# Patient Record
Sex: Male | Born: 1955 | Race: White | Hispanic: No | Marital: Single | Smoking: Current every day smoker
Health system: Southern US, Community
[De-identification: ages and names within clinical notes are randomized; demographics above are authoritative.]

## PROBLEM LIST (undated history)

## (undated) DIAGNOSIS — M1990 Unspecified osteoarthritis, unspecified site: Secondary | ICD-10-CM

## (undated) DIAGNOSIS — K219 Gastro-esophageal reflux disease without esophagitis: Secondary | ICD-10-CM

## (undated) DIAGNOSIS — Z973 Presence of spectacles and contact lenses: Secondary | ICD-10-CM

## (undated) DIAGNOSIS — I011 Acute rheumatic endocarditis: Secondary | ICD-10-CM

## (undated) DIAGNOSIS — R682 Dry mouth, unspecified: Secondary | ICD-10-CM

## (undated) DIAGNOSIS — Z972 Presence of dental prosthetic device (complete) (partial): Secondary | ICD-10-CM

## (undated) DIAGNOSIS — F17200 Nicotine dependence, unspecified, uncomplicated: Secondary | ICD-10-CM

## (undated) DIAGNOSIS — G4730 Sleep apnea, unspecified: Secondary | ICD-10-CM

## (undated) DIAGNOSIS — J45909 Unspecified asthma, uncomplicated: Secondary | ICD-10-CM

## (undated) DIAGNOSIS — E039 Hypothyroidism, unspecified: Secondary | ICD-10-CM

## (undated) DIAGNOSIS — I10 Essential (primary) hypertension: Secondary | ICD-10-CM

## (undated) HISTORY — PX: COLONOSCOPY: SHX174

## (undated) HISTORY — PX: WISDOM TOOTH EXTRACTION: SHX21

## (undated) HISTORY — PX: MULTIPLE TOOTH EXTRACTIONS: SHX2053

## (undated) HISTORY — PX: ROTATOR CUFF REPAIR: SHX139

## (undated) HISTORY — PX: HERNIA REPAIR: SHX51

---

## 2019-07-29 ENCOUNTER — Other Ambulatory Visit: Payer: Self-pay | Admitting: Otolaryngology

## 2019-08-03 ENCOUNTER — Ambulatory Visit (HOSPITAL_BASED_OUTPATIENT_CLINIC_OR_DEPARTMENT_OTHER): Admission: RE | Admit: 2019-08-03 | Payer: Medicare HMO | Source: Home / Self Care | Admitting: Otolaryngology

## 2019-08-03 ENCOUNTER — Encounter (HOSPITAL_BASED_OUTPATIENT_CLINIC_OR_DEPARTMENT_OTHER): Admission: RE | Payer: Self-pay | Source: Home / Self Care

## 2019-08-03 SURGERY — DRUG INDUCED SLEEP ENDOSCOPY
Anesthesia: General

## 2019-08-05 ENCOUNTER — Other Ambulatory Visit: Payer: Self-pay

## 2019-08-05 ENCOUNTER — Encounter (HOSPITAL_BASED_OUTPATIENT_CLINIC_OR_DEPARTMENT_OTHER): Payer: Self-pay | Admitting: Otolaryngology

## 2019-08-11 ENCOUNTER — Other Ambulatory Visit (HOSPITAL_COMMUNITY): Payer: Medicare HMO

## 2019-08-12 NOTE — Progress Notes (Signed)
Reminded pt to go for covid test, pt states will go tomorrow, appt scheduled.

## 2019-08-13 ENCOUNTER — Other Ambulatory Visit (HOSPITAL_COMMUNITY)
Admission: RE | Admit: 2019-08-13 | Discharge: 2019-08-13 | Disposition: A | Discharge status: Home / Self Care | Payer: Medicare HMO | Source: Ambulatory Visit | Attending: Otolaryngology | Admitting: Otolaryngology

## 2019-08-13 DIAGNOSIS — Z01812 Encounter for preprocedural laboratory examination: Secondary | ICD-10-CM | POA: Insufficient documentation

## 2019-08-13 DIAGNOSIS — Z20822 Contact with and (suspected) exposure to covid-19: Secondary | ICD-10-CM | POA: Diagnosis not present

## 2019-08-13 LAB — SARS CORONAVIRUS 2 (TAT 6-24 HRS): SARS Coronavirus 2: NEGATIVE

## 2019-08-15 ENCOUNTER — Ambulatory Visit (HOSPITAL_BASED_OUTPATIENT_CLINIC_OR_DEPARTMENT_OTHER): Payer: Medicare HMO | Admitting: Certified Registered"

## 2019-08-15 ENCOUNTER — Other Ambulatory Visit: Payer: Self-pay

## 2019-08-15 ENCOUNTER — Encounter (HOSPITAL_BASED_OUTPATIENT_CLINIC_OR_DEPARTMENT_OTHER): Payer: Self-pay | Admitting: Otolaryngology

## 2019-08-15 ENCOUNTER — Ambulatory Visit (HOSPITAL_BASED_OUTPATIENT_CLINIC_OR_DEPARTMENT_OTHER)
Admission: RE | Admit: 2019-08-15 | Discharge: 2019-08-15 | Disposition: A | Discharge status: Home / Self Care | Payer: Medicare HMO | Attending: Otolaryngology | Admitting: Otolaryngology

## 2019-08-15 ENCOUNTER — Encounter (HOSPITAL_BASED_OUTPATIENT_CLINIC_OR_DEPARTMENT_OTHER)
Admission: RE | Disposition: A | Discharge status: Home / Self Care | Payer: Self-pay | Source: Home / Self Care | Attending: Otolaryngology

## 2019-08-15 DIAGNOSIS — M069 Rheumatoid arthritis, unspecified: Secondary | ICD-10-CM | POA: Diagnosis not present

## 2019-08-15 DIAGNOSIS — Z7951 Long term (current) use of inhaled steroids: Secondary | ICD-10-CM | POA: Insufficient documentation

## 2019-08-15 DIAGNOSIS — I10 Essential (primary) hypertension: Secondary | ICD-10-CM | POA: Diagnosis not present

## 2019-08-15 DIAGNOSIS — E039 Hypothyroidism, unspecified: Secondary | ICD-10-CM | POA: Diagnosis not present

## 2019-08-15 DIAGNOSIS — Z79899 Other long term (current) drug therapy: Secondary | ICD-10-CM | POA: Diagnosis not present

## 2019-08-15 DIAGNOSIS — Z7952 Long term (current) use of systemic steroids: Secondary | ICD-10-CM | POA: Diagnosis not present

## 2019-08-15 DIAGNOSIS — Z79890 Hormone replacement therapy (postmenopausal): Secondary | ICD-10-CM | POA: Insufficient documentation

## 2019-08-15 DIAGNOSIS — J45909 Unspecified asthma, uncomplicated: Secondary | ICD-10-CM | POA: Insufficient documentation

## 2019-08-15 DIAGNOSIS — Z791 Long term (current) use of non-steroidal anti-inflammatories (NSAID): Secondary | ICD-10-CM | POA: Insufficient documentation

## 2019-08-15 DIAGNOSIS — G4733 Obstructive sleep apnea (adult) (pediatric): Secondary | ICD-10-CM | POA: Insufficient documentation

## 2019-08-15 DIAGNOSIS — F17210 Nicotine dependence, cigarettes, uncomplicated: Secondary | ICD-10-CM | POA: Diagnosis not present

## 2019-08-15 DIAGNOSIS — K219 Gastro-esophageal reflux disease without esophagitis: Secondary | ICD-10-CM | POA: Insufficient documentation

## 2019-08-15 HISTORY — PX: DRUG INDUCED ENDOSCOPY: SHX6808

## 2019-08-15 HISTORY — DX: Sleep apnea, unspecified: G47.30

## 2019-08-15 HISTORY — DX: Essential (primary) hypertension: I10

## 2019-08-15 HISTORY — DX: Unspecified asthma, uncomplicated: J45.909

## 2019-08-15 HISTORY — DX: Hypothyroidism, unspecified: E03.9

## 2019-08-15 HISTORY — DX: Gastro-esophageal reflux disease without esophagitis: K21.9

## 2019-08-15 HISTORY — DX: Acute rheumatic endocarditis: I01.1

## 2019-08-15 SURGERY — DRUG INDUCED SLEEP ENDOSCOPY
Anesthesia: General | Site: Mouth

## 2019-08-15 MED ORDER — OXYCODONE HCL 5 MG/5ML PO SOLN: 5.0000 mg | Freq: Once | ORAL | Status: DC | PRN

## 2019-08-15 MED ORDER — MIDAZOLAM HCL 2 MG/2ML IJ SOLN: 1.0000 mg | INTRAMUSCULAR | Status: DC | PRN

## 2019-08-15 MED ORDER — FENTANYL CITRATE (PF) 100 MCG/2ML IJ SOLN: 50.0000 ug | INTRAMUSCULAR | Status: DC | PRN

## 2019-08-15 MED ORDER — PROPOFOL 500 MG/50ML IV EMUL
INTRAVENOUS | Status: DC | PRN
  Administered 2019-08-15: 35 ug/kg/min via INTRAVENOUS

## 2019-08-15 MED ORDER — MEPERIDINE HCL 25 MG/ML IJ SOLN: 6.2500 mg | INTRAMUSCULAR | Status: DC | PRN

## 2019-08-15 MED ORDER — FENTANYL CITRATE (PF) 100 MCG/2ML IJ SOLN: 25.0000 ug | INTRAMUSCULAR | Status: DC | PRN

## 2019-08-15 MED ORDER — ACETAMINOPHEN 160 MG/5ML PO SOLN: 325.0000 mg | ORAL | Status: DC | PRN

## 2019-08-15 MED ORDER — ONDANSETRON HCL 4 MG/2ML IJ SOLN: 4.0000 mg | Freq: Once | INTRAMUSCULAR | Status: DC | PRN

## 2019-08-15 MED ORDER — ONDANSETRON HCL 4 MG/2ML IJ SOLN
INTRAMUSCULAR | Status: DC | PRN
  Administered 2019-08-15: 4 mg via INTRAVENOUS

## 2019-08-15 MED ORDER — LACTATED RINGERS IV SOLN: INTRAVENOUS | Status: DC

## 2019-08-15 MED ORDER — ACETAMINOPHEN 325 MG PO TABS: 325.0000 mg | ORAL_TABLET | ORAL | Status: DC | PRN

## 2019-08-15 MED ORDER — OXYCODONE HCL 5 MG PO TABS: 5.0000 mg | ORAL_TABLET | Freq: Once | ORAL | Status: DC | PRN

## 2019-08-15 MED ORDER — OXYMETAZOLINE HCL 0.05 % NA SOLN
NASAL | Status: DC | PRN
  Administered 2019-08-15: 1 via TOPICAL

## 2019-08-15 SURGICAL SUPPLY — 15 items
CANISTER SUCT 1200ML W/VALVE (MISCELLANEOUS) ×3 IMPLANT
COVER WAND RF STERILE (DRAPES) IMPLANT
GLOVE BIO SURGEON STRL SZ7.5 (GLOVE) ×3 IMPLANT
KIT CLEAN ENDO (MISCELLANEOUS) ×3 IMPLANT
NDL PRECISIONGLIDE 27X1.5 (NEEDLE) IMPLANT
NEEDLE PRECISIONGLIDE 27X1.5 (NEEDLE) IMPLANT
PATTIES SURGICAL .5 X3 (DISPOSABLE) ×3 IMPLANT
SET BASIN DAY SURGERY F.S. (CUSTOM PROCEDURE TRAY) ×3 IMPLANT
SHEET MEDIUM DRAPE 40X70 STRL (DRAPES) IMPLANT
SOL ANTI FOG 6CC (MISCELLANEOUS) ×1 IMPLANT
SOLUTION ANTI FOG 6CC (MISCELLANEOUS) ×2
SYR CONTROL 10ML LL (SYRINGE) IMPLANT
TOWEL GREEN STERILE FF (TOWEL DISPOSABLE) ×3 IMPLANT
TUBE CONNECTING 20'X1/4 (TUBING) ×1
TUBE CONNECTING 20X1/4 (TUBING) ×2 IMPLANT

## 2019-08-15 NOTE — Anesthesia Preprocedure Evaluation (Addendum)
Anesthesia Evaluation  Patient identified by MRN, date of birth, ID band Patient awake    Reviewed: Allergy & Precautions, NPO status , Patient's Chart, lab work & pertinent test results  Airway Mallampati: I       Dental no notable dental hx. (+) Teeth Intact   Pulmonary asthma , sleep apnea , Current Smoker and Patient abstained from smoking.,    Pulmonary exam normal breath sounds clear to auscultation       Cardiovascular hypertension, Pt. on medications Normal cardiovascular exam Rhythm:Regular Rate:Normal     Neuro/Psych negative neurological ROS  negative psych ROS   GI/Hepatic Neg liver ROS, GERD  Medicated and Controlled,  Endo/Other  Hypothyroidism   Renal/GU negative Renal ROS  negative genitourinary   Musculoskeletal negative musculoskeletal ROS (+)   Abdominal Normal abdominal exam  (+)   Peds  Hematology negative hematology ROS (+)   Anesthesia Other Findings   Reproductive/Obstetrics                             Anesthesia Physical Anesthesia Plan  ASA: II  Anesthesia Plan: General   Post-op Pain Management:    Induction: Intravenous  PONV Risk Score and Plan: Ondansetron, Dexamethasone and Propofol infusion  Airway Management Planned: Natural Airway and Nasal Cannula  Additional Equipment: None  Intra-op Plan:   Post-operative Plan:   Informed Consent: I have reviewed the patients History and Physical, chart, labs and discussed the procedure including the risks, benefits and alternatives for the proposed anesthesia with the patient or authorized representative who has indicated his/her understanding and acceptance.     Dental advisory given  Plan Discussed with: CRNA  Anesthesia Plan Comments:        Anesthesia Quick Evaluation

## 2019-08-15 NOTE — H&P (Signed)
Zachary Henderson is an 64 y.o. male.   Chief Complaint: Sleep apnea HPI: 64 year old male with obstructive sleep apnea who has had difficulty tolerating CPAP.  He presents of sleep endoscopy.  Past Medical History:  Diagnosis Date  . Asthma   . GERD (gastroesophageal reflux disease)   . Hypertension   . Hypothyroidism   . Rheumatoid aortitis   . Sleep apnea     History reviewed. No pertinent surgical history.  History reviewed. No pertinent family history. Social History:  reports that he has been smoking cigarettes. He has been smoking about 1.00 pack per day. He has never used smokeless tobacco. He reports previous drug use. He reports that he does not drink alcohol.  Allergies:  Allergies  Allergen Reactions  . Other     Peppers, onions and cats      Medications Prior to Admission  Medication Sig Dispense Refill  . acetaminophen (TYLENOL) 650 MG CR tablet Take 650 mg by mouth every 8 (eight) hours as needed for pain.    Marland Kitchen albuterol (VENTOLIN HFA) 108 (90 Base) MCG/ACT inhaler Inhale into the lungs every 6 (six) hours as needed for wheezing or shortness of breath.    Marland Kitchen amitriptyline (ELAVIL) 75 MG tablet Take 75 mg by mouth at bedtime.    Marland Kitchen atorvastatin (LIPITOR) 20 MG tablet Take 20 mg by mouth daily.    . baclofen (LIORESAL) 10 MG tablet Take 10 mg by mouth 3 (three) times daily.    . celecoxib (CELEBREX) 200 MG capsule Take 200 mg by mouth 2 (two) times daily.    . fexofenadine (ALLEGRA) 180 MG tablet Take 180 mg by mouth daily.    . fluticasone (FLONASE) 50 MCG/ACT nasal spray Place into both nostrils daily.    . Fluticasone-Salmeterol (ADVAIR) 250-50 MCG/DOSE AEPB Inhale 1 puff into the lungs 2 (two) times daily.    Marland Kitchen gabapentin (NEURONTIN) 300 MG capsule Take 300 mg by mouth 3 (three) times daily.    . hydroxychloroquine (PLAQUENIL) 200 MG tablet Take by mouth daily.    Marland Kitchen levothyroxine (SYNTHROID) 88 MCG tablet Take 88 mcg by mouth daily before breakfast.    .  lisinopril (ZESTRIL) 10 MG tablet Take 10 mg by mouth daily.    . montelukast (SINGULAIR) 10 MG tablet Take 10 mg by mouth at bedtime.    Marland Kitchen omeprazole (PRILOSEC) 40 MG capsule Take 40 mg by mouth daily.    . predniSONE (DELTASONE) 2.5 MG tablet Take 2.5 mg by mouth daily with breakfast.    . rOPINIRole (REQUIP) 1 MG tablet Take 1 mg by mouth at bedtime.      Results for orders placed or performed during the hospital encounter of 08/13/19 (from the past 48 hour(s))  SARS CORONAVIRUS 2 (TAT 6-24 HRS) Nasopharyngeal Nasopharyngeal Swab     Status: None   Collection Time: 08/13/19 12:15 PM   Specimen: Nasopharyngeal Swab  Result Value Ref Range   SARS Coronavirus 2 NEGATIVE NEGATIVE    Comment: (NOTE) SARS-CoV-2 target nucleic acids are NOT DETECTED. The SARS-CoV-2 RNA is generally detectable in upper and lower respiratory specimens during the acute phase of infection. Negative results do not preclude SARS-CoV-2 infection, do not rule out co-infections with other pathogens, and should not be used as the sole basis for treatment or other patient management decisions. Negative results must be combined with clinical observations, patient history, and epidemiological information. The expected result is Negative. Fact Sheet for Patients: [employer]Slick[employer]no Fact Sheet for Healthcare  Providers: https://www.woods-mathews.com/ This test is not yet approved or cleared by the [place] and  has been authorized for detection and/or diagnosis of SARS-CoV-2 by FDA under an Emergency Use Authorization (EUA). This EUA will remain  in effect (meaning this test can be used) for the duration of the COVID-19 declaration under Section 56 4(b)(1) of the Act, 21 U.S.C. section 360bbb-3(b)(1), unless the authorization is terminated or revoked sooner. Performed at [facility], [place] [address], [place], [place] [address]    No results found.  Review of  Systems  All other systems reviewed and are negative.   Blood pressure 135/86, pulse 79, temperature (!) 97.5 F (36.4 C), temperature source Oral, resp. rate 20, height 5\' 5"  (1.651 m), weight 75.4 kg, SpO2 100 %. Physical Exam  Constitutional: He is oriented to person, place, and time. He appears well-developed and well-nourished. No distress.  HENT:  Head: Normocephalic and atraumatic.  Right Ear: External ear normal.  Left Ear: External ear normal.  Nose: Nose normal.  Mouth/Throat: Oropharynx is clear and moist.  Eyes: Pupils are equal, round, and reactive to light. Conjunctivae and EOM are normal.  Cardiovascular: Normal rate.  Respiratory: Effort normal.  Musculoskeletal:     Cervical back: Normal range of motion and neck supple.  Neurological: He is alert and oriented to person, place, and time. No cranial nerve deficit.  Skin: Skin is warm and dry.  Psychiatric: He has a normal mood and affect. His behavior is normal. Judgment and thought content normal.     Assessment/Plan Sleep apnea  To OR for drug-induced sleep endoscopy.  Melida Quitter, MD 08/15/2019, 10:43 AM

## 2019-08-15 NOTE — Anesthesia Postprocedure Evaluation (Signed)
Anesthesia Post Note  Patient: TAMOTSU WIEDERHOLT  Procedure(s) Performed: DRUG INDUCED ENDOSCOPY (N/A Mouth)     Patient location during evaluation: PACU Anesthesia Type: General Level of consciousness: awake Pain management: pain level controlled Vital Signs Assessment: post-procedure vital signs reviewed and stable Respiratory status: spontaneous breathing Cardiovascular status: stable Postop Assessment: no apparent nausea or vomiting Anesthetic complications: no    Last Vitals:  Vitals:   08/15/19 1128 08/15/19 1130  BP:    Pulse: 77   Resp: 16   Temp:  37 C  SpO2: 98%     Last Pain:  Vitals:   08/15/19 1130  TempSrc:   PainSc: 0-No pain   Pain Goal:                   Caren Macadam

## 2019-08-15 NOTE — Discharge Instructions (Signed)

## 2019-08-15 NOTE — Transfer of Care (Signed)
Immediate Anesthesia Transfer of Care Note  Patient: MAREO PORTILLA  Procedure(s) Performed: DRUG INDUCED ENDOSCOPY (N/A )  Patient Location: PACU  Anesthesia Type:MAC  Level of Consciousness: awake, alert , oriented and patient cooperative  Airway & Oxygen Therapy: Patient Spontanous Breathing and Patient connected to face mask oxygen  Post-op Assessment: Report given to RN and Post -op Vital signs reviewed and stable  Post vital signs: Reviewed and stable  Last Vitals:  Vitals Value Taken Time  BP    Temp    Pulse 79 08/15/19 1125  Resp 12 08/15/19 1125  SpO2 98 % 08/15/19 1125  Vitals shown include unvalidated device data.  Last Pain:  Vitals:   08/15/19 1016  TempSrc: Oral  PainSc: 0-No pain         Complications: No apparent anesthesia complications

## 2019-08-15 NOTE — Brief Op Note (Signed)
08/15/2019  11:18 AM  PATIENT:  Rob Hickman  64 y.o. male  PRE-OPERATIVE DIAGNOSIS:  obstructive sleep apnea  POST-OPERATIVE DIAGNOSIS:  obstructive sleep apnea  PROCEDURE:  Procedure(s): DRUG INDUCED ENDOSCOPY (N/A)  SURGEON:  Surgeon(s) and Role:    Christia Reading, MD - Primary  PHYSICIAN ASSISTANT:   ASSISTANTS: none   ANESTHESIA:   IV sedation  EBL: None  BLOOD ADMINISTERED:none  DRAINS: none   LOCAL MEDICATIONS USED:  NONE  SPECIMEN:  No Specimen  DISPOSITION OF SPECIMEN:  N/A  COUNTS:  YES  TOURNIQUET:  * No tourniquets in log *  DICTATION: .Other Dictation: Dictation Number [id number]  PLAN OF CARE: Discharge to home after PACU  PATIENT DISPOSITION:  PACU - hemodynamically stable.   Delay start of Pharmacological VTE agent (>24hrs) due to surgical blood loss or risk of bleeding: no

## 2019-08-15 NOTE — Op Note (Signed)
NAME: MUSA, REWERTS MEDICAL RECORD [id number] ACCOUNT [id number] DATE OF BIRTH:08-09-1955 FACILITY: [facility] LOCATION: [facility] PHYSICIAN:Belmont Valli Pearletha Alfred, MD  OPERATIVE REPORT  DATE OF PROCEDURE:  08/15/2019  PREOPERATIVE DIAGNOSIS:  Obstructive sleep apnea.  POSTOPERATIVE DIAGNOSIS:  Obstructive sleep apnea.  PROCEDURE:  Drug-induced sleep endoscopy.  SURGEON:  Christia Reading, MD  ANESTHESIA:  IV sedation.  COMPLICATIONS:  None.  INDICATIONS:  The patient is a 63 year old male with obstructive sleep apnea who has had difficulty tolerating CPAP and presents to the operating room for sleep endoscopy.  FINDINGS:  At the level of the velum, the collapse was entirely anterior-posterior making him a good candidate for hypoglossal nerve stimulator placement.  He did have some lateral wall collapse at the tongue base level.  DESCRIPTION OF PROCEDURE:  The patient was identified in the holding room, informed consent having been obtained including a discussion of risks, benefits and alternatives, the patient was brought to the operative suite and put the operative table in  supine position.  IV sedation was given and the patient was brought to a simulated sleep.  At this point, an Afrin pledget was placed in the right nasal passage and then removed after a couple of minutes.  The fiberoptic laryngoscope was then inserted through the right nasal passage to view the nasopharynx, oropharynx, and hypopharynx.   Findings are noted above.  The exam was recorded.  After this was completed, the scope was removed and the patient returned to anesthesia for wakeup and was moved to recovery room in stable condition.  CN/NUANCE  D:08/15/2019 T:08/15/2019 JOB:[id number]

## 2019-08-15 NOTE — Anesthesia Procedure Notes (Signed)
Procedure Name: MAC Date/Time: 08/15/2019 11:17 AM Performed by: Signe Colt, CRNA Pre-anesthesia Checklist: Patient identified, Emergency Drugs available, Suction available, Patient being monitored and Timeout performed Patient Re-evaluated:Patient Re-evaluated prior to induction Oxygen Delivery Method: Simple face mask

## 2019-08-16 ENCOUNTER — Encounter: Payer: Self-pay | Admitting: *Deleted

## 2019-08-26 ENCOUNTER — Other Ambulatory Visit: Payer: Self-pay | Admitting: Otolaryngology

## 2019-09-19 ENCOUNTER — Other Ambulatory Visit: Payer: Self-pay

## 2019-09-19 ENCOUNTER — Encounter (HOSPITAL_BASED_OUTPATIENT_CLINIC_OR_DEPARTMENT_OTHER): Payer: Self-pay | Admitting: Otolaryngology

## 2019-10-05 HISTORY — DX: Nicotine dependence, unspecified, uncomplicated: F17.200

## 2019-10-05 HISTORY — DX: Presence of dental prosthetic device (complete) (partial): Z97.2

## 2019-10-05 HISTORY — DX: Dry mouth, unspecified: R68.2

## 2019-10-05 HISTORY — DX: Unspecified osteoarthritis, unspecified site: M19.90

## 2019-10-05 HISTORY — DX: Presence of spectacles and contact lenses: Z97.3

## 2019-11-09 ENCOUNTER — Other Ambulatory Visit (HOSPITAL_COMMUNITY)
Admission: RE | Admit: 2019-11-09 | Discharge: 2019-11-09 | Disposition: A | Discharge status: Home / Self Care | Payer: Medicare HMO | Source: Ambulatory Visit | Attending: Otolaryngology | Admitting: Otolaryngology

## 2019-11-09 DIAGNOSIS — Z20822 Contact with and (suspected) exposure to covid-19: Secondary | ICD-10-CM | POA: Diagnosis not present

## 2019-11-09 DIAGNOSIS — Z01812 Encounter for preprocedural laboratory examination: Secondary | ICD-10-CM | POA: Insufficient documentation

## 2019-11-09 LAB — SARS CORONAVIRUS 2 (TAT 6-24 HRS): SARS Coronavirus 2: NEGATIVE

## 2019-11-10 ENCOUNTER — Encounter (HOSPITAL_COMMUNITY): Payer: Self-pay | Admitting: Otolaryngology

## 2019-11-10 ENCOUNTER — Other Ambulatory Visit: Payer: Self-pay

## 2019-11-10 NOTE — Progress Notes (Signed)
Pt denies SOB, chest pain, and being under the care of a cardiologist. Pt stated that PCP is Dr. Watt Climes. Pt denies having a stress test, echo and cardiac cath. Pt denies having an EKG and chest x ray. Pt denies recent labs. Pt made aware to stop taking, Sudafed, Aspirin (unless otherwise advised by surgeon), vitamins, fish oil and herbal medications. Do not take any NSAIDs ie: Celebrex, Ibuprofen, Advil, Naproxen (Aleve), Motrin, BC and Goody Powder. Pt stated that last dose of Sudafed was today. Pt reminded to quarantine. Pt verbalized understanding of all pre-op instructions.

## 2019-11-11 ENCOUNTER — Ambulatory Visit (HOSPITAL_COMMUNITY): Payer: Medicare HMO | Admitting: Certified Registered"

## 2019-11-11 ENCOUNTER — Ambulatory Visit (HOSPITAL_COMMUNITY)
Admission: RE | Admit: 2019-11-11 | Discharge: 2019-11-11 | Disposition: A | Discharge status: Home / Self Care | Payer: Medicare HMO | Attending: Otolaryngology | Admitting: Otolaryngology

## 2019-11-11 ENCOUNTER — Encounter (HOSPITAL_COMMUNITY)
Admission: RE | Disposition: A | Discharge status: Home / Self Care | Payer: Self-pay | Source: Home / Self Care | Attending: Otolaryngology

## 2019-11-11 ENCOUNTER — Encounter (HOSPITAL_COMMUNITY): Payer: Self-pay | Admitting: Otolaryngology

## 2019-11-11 ENCOUNTER — Ambulatory Visit (HOSPITAL_COMMUNITY): Payer: Medicare HMO

## 2019-11-11 DIAGNOSIS — K219 Gastro-esophageal reflux disease without esophagitis: Secondary | ICD-10-CM | POA: Diagnosis not present

## 2019-11-11 DIAGNOSIS — F17210 Nicotine dependence, cigarettes, uncomplicated: Secondary | ICD-10-CM | POA: Diagnosis not present

## 2019-11-11 DIAGNOSIS — R9431 Abnormal electrocardiogram [ECG] [EKG]: Secondary | ICD-10-CM | POA: Diagnosis not present

## 2019-11-11 DIAGNOSIS — Z7951 Long term (current) use of inhaled steroids: Secondary | ICD-10-CM | POA: Diagnosis not present

## 2019-11-11 DIAGNOSIS — Z79899 Other long term (current) drug therapy: Secondary | ICD-10-CM | POA: Diagnosis not present

## 2019-11-11 DIAGNOSIS — Z79890 Hormone replacement therapy (postmenopausal): Secondary | ICD-10-CM | POA: Insufficient documentation

## 2019-11-11 DIAGNOSIS — G4733 Obstructive sleep apnea (adult) (pediatric): Secondary | ICD-10-CM

## 2019-11-11 DIAGNOSIS — M069 Rheumatoid arthritis, unspecified: Secondary | ICD-10-CM | POA: Insufficient documentation

## 2019-11-11 DIAGNOSIS — I10 Essential (primary) hypertension: Secondary | ICD-10-CM | POA: Insufficient documentation

## 2019-11-11 DIAGNOSIS — J45909 Unspecified asthma, uncomplicated: Secondary | ICD-10-CM | POA: Diagnosis not present

## 2019-11-11 DIAGNOSIS — Z791 Long term (current) use of non-steroidal anti-inflammatories (NSAID): Secondary | ICD-10-CM | POA: Diagnosis not present

## 2019-11-11 DIAGNOSIS — E039 Hypothyroidism, unspecified: Secondary | ICD-10-CM | POA: Diagnosis not present

## 2019-11-11 DIAGNOSIS — Z7952 Long term (current) use of systemic steroids: Secondary | ICD-10-CM | POA: Insufficient documentation

## 2019-11-11 HISTORY — PX: IMPLANTATION OF HYPOGLOSSAL NERVE STIMULATOR: SHX6827

## 2019-11-11 LAB — BASIC METABOLIC PANEL
Anion gap: 13 (ref 5–15)
BUN: 11 mg/dL (ref 8–23)
CO2: 21 mmol/L — ABNORMAL LOW (ref 22–32)
Calcium: 9.2 mg/dL (ref 8.9–10.3)
Chloride: 105 mmol/L (ref 98–111)
Creatinine, Ser: 0.8 mg/dL (ref 0.61–1.24)
GFR calc Af Amer: >60 mL/min (ref >60)
GFR calc non Af Amer: >60 mL/min (ref >60)
Glucose, Bld: 85 mg/dL (ref 70–99)
Potassium: 4.3 mmol/L (ref 3.5–5.1)
Sodium: 139 mmol/L (ref 135–145)

## 2019-11-11 LAB — CBC
HCT: 49 % (ref 39.0–52.0)
Hemoglobin: 16.4 g/dL (ref 13.0–17.0)
MCH: 33.3 pg (ref 26.0–34.0)
MCHC: 33.5 g/dL (ref 30.0–36.0)
MCV: 99.6 fL (ref 80.0–100.0)
Platelets: 144 10*3/uL — ABNORMAL LOW (ref 150–400)
RBC: 4.92 MIL/uL (ref 4.22–5.81)
RDW: 12 % (ref 11.5–15.5)
WBC: 5.9 10*3/uL (ref 4.0–10.5)
nRBC: 0 % (ref 0.0–0.2)

## 2019-11-11 SURGERY — INSERTION, HYPOGLOSSAL NERVE STIMULATOR
Anesthesia: General | Laterality: Right

## 2019-11-11 MED ORDER — LIDOCAINE 2% (20 MG/ML) 5 ML SYRINGE
INTRAMUSCULAR | Status: DC | PRN
  Administered 2019-11-11: 60 mg via INTRAVENOUS

## 2019-11-11 MED ORDER — MIDAZOLAM HCL 2 MG/2ML IJ SOLN
INTRAMUSCULAR | Status: AC
  Filled 2019-11-11: qty 2

## 2019-11-11 MED ORDER — CEFAZOLIN SODIUM-DEXTROSE 2-4 GM/100ML-% IV SOLN
2.0000 g | INTRAVENOUS | Status: AC
  Administered 2019-11-11: 2 g via INTRAVENOUS
  Filled 2019-11-11: qty 100

## 2019-11-11 MED ORDER — DEXAMETHASONE SODIUM PHOSPHATE 10 MG/ML IJ SOLN
INTRAMUSCULAR | Status: AC
  Filled 2019-11-11: qty 2

## 2019-11-11 MED ORDER — LACTATED RINGERS IV SOLN: INTRAVENOUS | Status: DC

## 2019-11-11 MED ORDER — DEXAMETHASONE SODIUM PHOSPHATE 10 MG/ML IJ SOLN
INTRAMUSCULAR | Status: DC | PRN
  Administered 2019-11-11: 5 mg via INTRAVENOUS

## 2019-11-11 MED ORDER — SODIUM CHLORIDE 0.9 % IV SOLN
INTRAVENOUS | Status: AC
  Filled 2019-11-11: qty 500000

## 2019-11-11 MED ORDER — LABETALOL HCL 5 MG/ML IV SOLN
INTRAVENOUS | Status: AC
  Filled 2019-11-11: qty 4

## 2019-11-11 MED ORDER — CHLORHEXIDINE GLUCONATE 0.12 % MT SOLN
15.0000 mL | Freq: Once | OROMUCOSAL | Status: AC
  Filled 2019-11-11: qty 15

## 2019-11-11 MED ORDER — KETOROLAC TROMETHAMINE 30 MG/ML IJ SOLN
30.0000 mg | Freq: Once | INTRAMUSCULAR | Status: AC
  Administered 2019-11-11: 30 mg via INTRAVENOUS

## 2019-11-11 MED ORDER — PROPOFOL 500 MG/50ML IV EMUL
INTRAVENOUS | Status: DC | PRN
  Administered 2019-11-11: 50 ug/kg/min via INTRAVENOUS

## 2019-11-11 MED ORDER — HYDROCODONE-ACETAMINOPHEN 5-325 MG PO TABS
1.0000 | ORAL_TABLET | Freq: Four times a day (QID) | ORAL | Status: DC | PRN
  Administered 2019-11-11: 2 via ORAL

## 2019-11-11 MED ORDER — CEFAZOLIN SODIUM 1 G IJ SOLR
INTRAMUSCULAR | Status: AC
  Filled 2019-11-11: qty 10

## 2019-11-11 MED ORDER — MIDAZOLAM HCL 2 MG/2ML IJ SOLN
INTRAMUSCULAR | Status: DC | PRN
  Administered 2019-11-11: 2 mg via INTRAVENOUS

## 2019-11-11 MED ORDER — ONDANSETRON HCL 4 MG/2ML IJ SOLN
INTRAMUSCULAR | Status: AC
  Filled 2019-11-11: qty 4

## 2019-11-11 MED ORDER — SUCCINYLCHOLINE CHLORIDE 200 MG/10ML IV SOSY
PREFILLED_SYRINGE | INTRAVENOUS | Status: DC | PRN
  Administered 2019-11-11: 100 mg via INTRAVENOUS

## 2019-11-11 MED ORDER — LIDOCAINE-EPINEPHRINE 1 %-1:100000 IJ SOLN
INTRAMUSCULAR | Status: AC
  Filled 2019-11-11: qty 1

## 2019-11-11 MED ORDER — HYDROCODONE-ACETAMINOPHEN 5-325 MG PO TABS: 1.0000 | ORAL_TABLET | Freq: Four times a day (QID) | ORAL | 0 refills | Status: DC | PRN

## 2019-11-11 MED ORDER — EPHEDRINE 5 MG/ML INJ
INTRAVENOUS | Status: AC
  Filled 2019-11-11: qty 10

## 2019-11-11 MED ORDER — 0.9 % SODIUM CHLORIDE (POUR BTL) OPTIME
TOPICAL | Status: DC | PRN
  Administered 2019-11-11: 1000 mL

## 2019-11-11 MED ORDER — EPHEDRINE SULFATE-NACL 50-0.9 MG/10ML-% IV SOSY
PREFILLED_SYRINGE | INTRAVENOUS | Status: DC | PRN
  Administered 2019-11-11: 5 mg via INTRAVENOUS

## 2019-11-11 MED ORDER — SUGAMMADEX SODIUM 500 MG/5ML IV SOLN
INTRAVENOUS | Status: AC
  Filled 2019-11-11: qty 5

## 2019-11-11 MED ORDER — PHENYLEPHRINE HCL-NACL 10-0.9 MG/250ML-% IV SOLN
INTRAVENOUS | Status: DC | PRN
  Administered 2019-11-11: 20 ug/min via INTRAVENOUS

## 2019-11-11 MED ORDER — LIDOCAINE-EPINEPHRINE 1 %-1:100000 IJ SOLN
INTRAMUSCULAR | Status: DC | PRN
  Administered 2019-11-11: 5 mL

## 2019-11-11 MED ORDER — HYDROCODONE-ACETAMINOPHEN 5-325 MG PO TABS
ORAL_TABLET | ORAL | Status: AC
  Filled 2019-11-11: qty 2

## 2019-11-11 MED ORDER — FENTANYL CITRATE (PF) 250 MCG/5ML IJ SOLN
INTRAMUSCULAR | Status: DC | PRN
  Administered 2019-11-11: 100 ug via INTRAVENOUS
  Administered 2019-11-11: 50 ug via INTRAVENOUS

## 2019-11-11 MED ORDER — EPINEPHRINE 1 MG/10ML IJ SOSY
PREFILLED_SYRINGE | INTRAMUSCULAR | Status: AC
  Filled 2019-11-11: qty 10

## 2019-11-11 MED ORDER — FENTANYL CITRATE (PF) 250 MCG/5ML IJ SOLN
INTRAMUSCULAR | Status: AC
  Filled 2019-11-11: qty 5

## 2019-11-11 MED ORDER — HYDRALAZINE HCL 20 MG/ML IJ SOLN
INTRAMUSCULAR | Status: AC
  Filled 2019-11-11: qty 1

## 2019-11-11 MED ORDER — CHLORHEXIDINE GLUCONATE 0.12 % MT SOLN
OROMUCOSAL | Status: AC
  Administered 2019-11-11: 15 mL via OROMUCOSAL
  Filled 2019-11-11: qty 15

## 2019-11-11 MED ORDER — SODIUM CHLORIDE 0.9 % IV SOLN
INTRAVENOUS | Status: DC | PRN
  Administered 2019-11-11: 500 mL

## 2019-11-11 MED ORDER — SUCCINYLCHOLINE CHLORIDE 200 MG/10ML IV SOSY
PREFILLED_SYRINGE | INTRAVENOUS | Status: AC
  Filled 2019-11-11: qty 20

## 2019-11-11 MED ORDER — KETOROLAC TROMETHAMINE 30 MG/ML IJ SOLN
INTRAMUSCULAR | Status: AC
  Filled 2019-11-11: qty 1

## 2019-11-11 MED ORDER — PROPOFOL 10 MG/ML IV BOLUS
INTRAVENOUS | Status: DC | PRN
  Administered 2019-11-11: 150 mg via INTRAVENOUS

## 2019-11-11 MED ORDER — LABETALOL HCL 5 MG/ML IV SOLN
5.0000 mg | Freq: Once | INTRAVENOUS | Status: AC
  Administered 2019-11-11: 5 mg via INTRAVENOUS

## 2019-11-11 MED ORDER — HYDRALAZINE HCL 20 MG/ML IJ SOLN
10.0000 mg | Freq: Once | INTRAMUSCULAR | Status: AC
  Administered 2019-11-11: 10 mg via INTRAVENOUS

## 2019-11-11 MED ORDER — ONDANSETRON HCL 4 MG/2ML IJ SOLN
INTRAMUSCULAR | Status: DC | PRN
  Administered 2019-11-11: 4 mg via INTRAVENOUS

## 2019-11-11 MED ORDER — LIDOCAINE 2% (20 MG/ML) 5 ML SYRINGE
INTRAMUSCULAR | Status: AC
  Filled 2019-11-11: qty 10

## 2019-11-11 SURGICAL SUPPLY — 65 items
BAG DECANTER FOR FLEXI CONT (MISCELLANEOUS) ×3 IMPLANT
BLADE CLIPPER SURG (BLADE) IMPLANT
BLADE SURG 15 STRL LF DISP TIS (BLADE) ×2 IMPLANT
BLADE SURG 15 STRL SS (BLADE) ×4
CANISTER SUCT 3000ML PPV (MISCELLANEOUS) ×3 IMPLANT
CORD BIPOLAR FORCEPS 12FT (ELECTRODE) ×3 IMPLANT
COVER PROBE W GEL 5X96 (DRAPES) ×3 IMPLANT
COVER SURGICAL LIGHT HANDLE (MISCELLANEOUS) ×3 IMPLANT
COVER WAND RF STERILE (DRAPES) ×3 IMPLANT
DERMABOND ADVANCED (GAUZE/BANDAGES/DRESSINGS) ×4
DERMABOND ADVANCED .7 DNX12 (GAUZE/BANDAGES/DRESSINGS) ×2 IMPLANT
DRAPE C-ARM 35X43 STRL (DRAPES) ×6 IMPLANT
DRAPE HEAD BAR (DRAPES) ×3 IMPLANT
DRAPE INCISE IOBAN 66X45 STRL (DRAPES) ×3 IMPLANT
DRAPE MICROSCOPE LEICA 54X105 (DRAPES) ×3 IMPLANT
DRAPE UTILITY XL STRL (DRAPES) ×3 IMPLANT
DRSG TEGADERM 4X4.75 (GAUZE/BANDAGES/DRESSINGS) ×6 IMPLANT
ELECT COATED BLADE 2.86 ST (ELECTRODE) ×3 IMPLANT
ELECT EMG 18 NIMS (NEUROSURGERY SUPPLIES) ×6
ELECT REM PT RETURN 9FT ADLT (ELECTROSURGICAL) ×3
ELECTRODE EMG 18 NIMS (NEUROSURGERY SUPPLIES) ×2 IMPLANT
ELECTRODE REM PT RTRN 9FT ADLT (ELECTROSURGICAL) ×1 IMPLANT
FORCEPS BIPOLAR SPETZLER 8 1.0 (NEUROSURGERY SUPPLIES) ×3 IMPLANT
GAUZE 4X4 16PLY RFD (DISPOSABLE) ×3 IMPLANT
GAUZE SPONGE 4X4 12PLY STRL (GAUZE/BANDAGES/DRESSINGS) ×6 IMPLANT
GENERATOR PULSE INSPIRE (Generator) ×3 IMPLANT
GLOVE BIO SURGEON STRL SZ 6.5 (GLOVE) IMPLANT
GLOVE BIO SURGEON STRL SZ7.5 (GLOVE) ×3 IMPLANT
GLOVE BIO SURGEONS STRL SZ 6.5 (GLOVE)
GOWN STRL REUS W/ TWL LRG LVL3 (GOWN DISPOSABLE) ×3 IMPLANT
GOWN STRL REUS W/TWL LRG LVL3 (GOWN DISPOSABLE) ×6
KIT BASIN OR (CUSTOM PROCEDURE TRAY) ×3 IMPLANT
KIT NEUROSTIMULATOR ACCESSORY (KITS) IMPLANT
KIT TURNOVER KIT B (KITS) ×3 IMPLANT
LEAD SENSING RESP INSPIRE (Lead) ×3 IMPLANT
LEAD SLEEP STIMULATION INSPIRE (Lead) ×3 IMPLANT
LOOP VESSEL MAXI BLUE (MISCELLANEOUS) ×3 IMPLANT
LOOP VESSEL MINI RED (MISCELLANEOUS) ×3 IMPLANT
MARKER SKIN DUAL TIP RULER LAB (MISCELLANEOUS) ×6 IMPLANT
NEEDLE HYPO 25GX1X1/2 BEV (NEEDLE) ×3 IMPLANT
NS IRRIG 1000ML POUR BTL (IV SOLUTION) ×3 IMPLANT
PAD ARMBOARD 7.5X6 YLW CONV (MISCELLANEOUS) ×3 IMPLANT
PASSER CATH 38CM DISP (INSTRUMENTS) ×3 IMPLANT
PENCIL SMOKE EVACUATOR (MISCELLANEOUS) ×3 IMPLANT
POSITIONER HEAD DONUT 9IN (MISCELLANEOUS) ×3 IMPLANT
PROBE NERVE STIMULATOR (NEUROSURGERY SUPPLIES) ×3 IMPLANT
REMOTE CONTROL SLEEP INSPIRE (MISCELLANEOUS) ×3 IMPLANT
SET WALTER ACTIVATION W/DRAPE (SET/KITS/TRAYS/PACK) ×3 IMPLANT
SLING ARM FOAM STRAP LRG (SOFTGOODS) ×3 IMPLANT
SLING ARM FOAM STRAP MED (SOFTGOODS) IMPLANT
SPONGE INTESTINAL PEANUT (DISPOSABLE) ×3 IMPLANT
STAPLER VISISTAT 35W (STAPLE) ×3 IMPLANT
SUT SILK 2 0 SH (SUTURE) ×6 IMPLANT
SUT SILK 3 0 REEL (SUTURE) ×3 IMPLANT
SUT SILK 3 0 SH 30 (SUTURE) ×6 IMPLANT
SUT SILK 3-0 (SUTURE) ×2
SUT SILK 3-0 RB1 30XBRD (SUTURE) ×1
SUT VIC AB 3-0 SH 27 (SUTURE) ×4
SUT VIC AB 3-0 SH 27X BRD (SUTURE) ×2 IMPLANT
SUT VIC AB 4-0 PS2 27 (SUTURE) ×6 IMPLANT
SUTURE SILK 3-0 RB1 30XBRD (SUTURE) ×1 IMPLANT
SYR 10ML LL (SYRINGE) ×3 IMPLANT
TAPE CLOTH SURG 4X10 WHT LF (GAUZE/BANDAGES/DRESSINGS) ×3 IMPLANT
TOWEL GREEN STERILE (TOWEL DISPOSABLE) ×3 IMPLANT
TRAY ENT MC OR (CUSTOM PROCEDURE TRAY) ×3 IMPLANT

## 2019-11-11 NOTE — Anesthesia Preprocedure Evaluation (Addendum)
Anesthesia Evaluation  Patient identified by MRN, date of birth, ID band Patient awake    Reviewed: Allergy & Precautions, NPO status , Patient's Chart, lab work & pertinent test results  Airway Mallampati: I       Dental no notable dental hx. (+) Teeth Intact   Pulmonary asthma , sleep apnea , Current Smoker and Patient abstained from smoking.,    Pulmonary exam normal breath sounds clear to auscultation       Cardiovascular hypertension, Pt. on medications Normal cardiovascular exam Rhythm:Regular Rate:Normal     Neuro/Psych negative neurological ROS  negative psych ROS   GI/Hepatic Neg liver ROS, GERD  Medicated and Controlled,  Endo/Other  Hypothyroidism   Renal/GU negative Renal ROS  negative genitourinary   Musculoskeletal  (+) Arthritis , Rheumatoid disorders,    Abdominal Normal abdominal exam  (+)   Peds  Hematology negative hematology ROS (+)   Anesthesia Other Findings   Reproductive/Obstetrics                             Anesthesia Physical Anesthesia Plan  ASA: III  Anesthesia Plan: General   Post-op Pain Management:    Induction: Intravenous  PONV Risk Score and Plan: 1 and Ondansetron, Dexamethasone, Treatment may vary due to age or medical condition and Midazolam  Airway Management Planned: Oral ETT  Additional Equipment: None  Intra-op Plan:   Post-operative Plan: Extubation in OR  Informed Consent: I have reviewed the patients History and Physical, chart, labs and discussed the procedure including the risks, benefits and alternatives for the proposed anesthesia with the patient or authorized representative who has indicated his/her understanding and acceptance.     Dental advisory given  Plan Discussed with:   Anesthesia Plan Comments:         Anesthesia Quick Evaluation

## 2019-11-11 NOTE — Brief Op Note (Signed)
11/11/2019  1:40 PM  PATIENT:  Rob Hickman  64 y.o. male  PRE-OPERATIVE DIAGNOSIS:  OBSTRUCTIVE SLEEP APNEA  POST-OPERATIVE DIAGNOSIS:  OBSTRUCTIVE SLEEP APNEA  PROCEDURE:  Procedure(s): IMPLANTATION OF HYPOGLOSSAL NERVE STIMULATOR (Right)  SURGEON:  Surgeon(s) and Role:    Christia Reading, MD - Primary  PHYSICIAN ASSISTANT: Clovis Cao  ASSISTANTS: none   ANESTHESIA:   general  EBL: Minimal  BLOOD ADMINISTERED:none  DRAINS: none   LOCAL MEDICATIONS USED:  LIDOCAINE   SPECIMEN:  No Specimen  DISPOSITION OF SPECIMEN:  N/A  COUNTS:  YES  TOURNIQUET:  * No tourniquets in log *  DICTATION: .Note written in EPIC  PLAN OF CARE: Discharge to home after PACU  PATIENT DISPOSITION:  PACU - hemodynamically stable.   Delay start of Pharmacological VTE agent (>24hrs) due to surgical blood loss or risk of bleeding: no

## 2019-11-11 NOTE — H&P (Signed)
Zachary Henderson is an 64 y.o. male.   Chief Complaint: Obstructive sleep apnea HPI: 64 year old male with OSA who has not been able to tolerate CPAP.  He presents for [facility] placement.  Past Medical History:  Diagnosis Date  . Arthritis    RA  . Asthma   . Dry mouth   . GERD (gastroesophageal reflux disease)   . Heavy smoker    smokes 1pp/d  . Hypertension   . Hypothyroidism   . Rheumatoid aortitis   . Sleep apnea    does not wear CPAP  . Wears dentures    top plate  . Wears glasses     Past Surgical History:  Procedure Laterality Date  . COLONOSCOPY    . DRUG INDUCED ENDOSCOPY N/A 08/15/2019   Procedure: DRUG INDUCED ENDOSCOPY;  Surgeon: Christia Reading, MD;  Location: [facility];  Service: ENT;  Laterality: N/A;  . HERNIA REPAIR Bilateral   . MULTIPLE TOOTH EXTRACTIONS    . ROTATOR CUFF REPAIR Right   . WISDOM TOOTH EXTRACTION      History reviewed. No pertinent family history. Social History:  reports that he has been smoking cigarettes. He has been smoking about 1.00 pack per day. He has never used smokeless tobacco. He reports previous alcohol use. He reports current drug use. Drug: Marijuana.  Allergies:  Allergies  Allergen Reactions  . Other     Bell peppers causes food poisoning  Cats shortness of breath, anaphylaxis    . Onion Diarrhea and Nausea And Vomiting    Causes food poisoning      Medications Prior to Admission  Medication Sig Dispense Refill  . acetaminophen (TYLENOL) 650 MG CR tablet Take 650-1,300 mg by mouth every 8 (eight) hours as needed for pain.     Marland Kitchen albuterol (VENTOLIN HFA) 108 (90 Base) MCG/ACT inhaler Inhale 1-2 puffs into the lungs every 6 (six) hours as needed for wheezing or shortness of breath.     Marland Kitchen amitriptyline (ELAVIL) 75 MG tablet Take 75 mg by mouth at bedtime.    . Ascorbic Acid (VITAMIN C) 1000 MG tablet Take 1,000 mg by mouth daily.    Marland Kitchen atorvastatin (LIPITOR) 20 MG tablet Take 20 mg by mouth daily.    .  baclofen (LIORESAL) 10 MG tablet Take 10 mg by mouth 2 (two) times daily.     . celecoxib (CELEBREX) 200 MG capsule Take 200 mg by mouth 2 (two) times daily.    . cholecalciferol (VITAMIN D3) 25 MCG (1000 UNIT) tablet Take 1,000 Units by mouth daily.    . fexofenadine (ALLEGRA) 180 MG tablet Take 180 mg by mouth daily.    . fluticasone (FLONASE) 50 MCG/ACT nasal spray Place 2 sprays into both nostrils daily.     . Fluticasone-Salmeterol (ADVAIR) 250-50 MCG/DOSE AEPB Inhale 1 puff into the lungs 2 (two) times daily.    Marland Kitchen gabapentin (NEURONTIN) 300 MG capsule Take 300 mg by mouth 3 (three) times daily.    . hydroxychloroquine (PLAQUENIL) 200 MG tablet Take 200 mg by mouth 2 (two) times daily.     Marland Kitchen levothyroxine (SYNTHROID) 88 MCG tablet Take 88 mcg by mouth daily before breakfast.    . lisinopril (ZESTRIL) 10 MG tablet Take 10 mg by mouth daily.    . montelukast (SINGULAIR) 10 MG tablet Take 10 mg by mouth at bedtime.    Marland Kitchen omeprazole (PRILOSEC) 40 MG capsule Take 40 mg by mouth daily.    . predniSONE (  DELTASONE) 2.5 MG tablet Take 2.5 mg by mouth daily with breakfast.    . pseudoephedrine (SUDAFED) 120 MG 12 hr tablet Take 120 mg by mouth daily as needed for congestion.    Marland Kitchen rOPINIRole (REQUIP) 1 MG tablet Take 1 mg by mouth at bedtime.    . SUPER B COMPLEX/C PO Take 1 tablet by mouth daily.    . vitamin E 180 MG (400 UNITS) capsule Take 400 Units by mouth daily.      Results for orders placed or performed during the hospital encounter of 11/11/19 (from the past 48 hour(s))  CBC     Status: Abnormal   Collection Time: 11/11/19  8:28 AM  Result Value Ref Range   WBC 5.9 4.0 - 10.5 K/uL   RBC 4.92 4.22 - 5.81 MIL/uL   Hemoglobin 16.4 13.0 - 17.0 g/dL   HCT 53.6 39 - 52 %   MCV 99.6 80.0 - 100.0 fL   MCH 33.3 26.0 - 34.0 pg   MCHC 33.5 30.0 - 36.0 g/dL   RDW 64.4 03.4 - 74.2 %   Platelets 144 (L) 150 - 400 K/uL   nRBC 0.0 0.0 - 0.2 %    Comment: Performed at [facility], [address] [place], [place] [address]   No results found.  Review of Systems  All other systems reviewed and are negative.   Blood pressure (!) 136/98, pulse 66, temperature 98.1 F (36.7 C), resp. rate 20, height 5\' 5"  (1.651 m), weight 75.4 kg, SpO2 100 %. Physical Exam Constitutional:      Appearance: Normal appearance. He is normal weight.  HENT:     Head: Normocephalic and atraumatic.     Right Ear: External ear normal.     Left Ear: External ear normal.     Nose: Nose normal.     Mouth/Throat:     Mouth: Mucous membranes are moist.     Pharynx: Oropharynx is clear.  Eyes:     Extraocular Movements: Extraocular movements intact.     Conjunctiva/sclera: Conjunctivae normal.     Pupils: Pupils are equal, round, and reactive to light.  Cardiovascular:     Rate and Rhythm: Normal rate.  Pulmonary:     Effort: Pulmonary effort is normal.  Skin:    General: Skin is warm and dry.  Neurological:     General: No focal deficit present.     Mental Status: He is alert and oriented to person, place, and time.  Psychiatric:        Mood and Affect: Mood normal.        Behavior: Behavior normal.        Thought Content: Thought content normal.        Judgment: Judgment normal.      Assessment/Plan Obstructive sleep apnea  To OR for hypoglossal nerve stimulator placement.  , MD 11/11/2019, 9:56 AM

## 2019-11-11 NOTE — Anesthesia Procedure Notes (Signed)
Procedure Name: Intubation Date/Time: 11/11/2019 11:45 AM Performed by: Elliot Dally, CRNA Pre-anesthesia Checklist: Patient identified, Emergency Drugs available, Suction available and Patient being monitored Patient Re-evaluated:Patient Re-evaluated prior to induction Oxygen Delivery Method: Circle System Utilized Preoxygenation: Pre-oxygenation with 100% oxygen Induction Type: IV induction Ventilation: Mask ventilation without difficulty Laryngoscope Size: Miller and 3 Grade View: Grade I Tube type: Oral Tube size: 7.5 mm Number of attempts: 1 Airway Equipment and Method: Stylet and Oral airway Placement Confirmation: ETT inserted through vocal cords under direct vision,  positive ETCO2 and breath sounds checked- equal and bilateral Secured at: 22 cm Tube secured with: Tape Dental Injury: Teeth and Oropharynx as per pre-operative assessment

## 2019-11-11 NOTE — Transfer of Care (Signed)
Immediate Anesthesia Transfer of Care Note  Patient: Zachary Henderson  Procedure(s) Performed: IMPLANTATION OF HYPOGLOSSAL NERVE STIMULATOR (Right )  Patient Location: PACU  Anesthesia Type:General  Level of Consciousness: awake, alert  and oriented  Airway & Oxygen Therapy: Patient Spontanous Breathing and Patient connected to nasal cannula oxygen  Post-op Assessment: Report given to RN and Post -op Vital signs reviewed and stable  Post vital signs: Reviewed and stable  Last Vitals:  Vitals Value Taken Time  BP 170/84 11/11/19 1407  Temp    Pulse 92 11/11/19 1408  Resp 25 11/11/19 1408  SpO2 89 % 11/11/19 1408  Vitals shown include unvalidated device data.  Last Pain:  Vitals:   11/11/19 0850  PainSc: 0-No pain         Complications: No complications documented.

## 2019-11-11 NOTE — Op Note (Signed)

## 2019-11-14 ENCOUNTER — Encounter (HOSPITAL_COMMUNITY): Payer: Self-pay | Admitting: Otolaryngology

## 2019-11-14 NOTE — Anesthesia Postprocedure Evaluation (Signed)
Anesthesia Post Note  Patient: Zachary Henderson  Procedure(s) Performed: IMPLANTATION OF HYPOGLOSSAL NERVE STIMULATOR (Right )     Patient location during evaluation: PACU Anesthesia Type: General Level of consciousness: awake and alert Pain management: pain level controlled Vital Signs Assessment: post-procedure vital signs reviewed and stable Respiratory status: spontaneous breathing, nonlabored ventilation and respiratory function stable Cardiovascular status: blood pressure returned to baseline and stable Postop Assessment: no apparent nausea or vomiting Anesthetic complications: no   No complications documented.  Last Vitals:  Vitals:   11/11/19 1500 11/11/19 1514  BP: (!) 147/89 (!) 146/97  Pulse: 70 71  Resp: 15 16  Temp:  36.5 C  SpO2: 97% 99%    Last Pain:  Vitals:   11/11/19 1410  PainSc: 0-No pain   Pain Goal:                   Lucretia Kern

## 2019-12-21 ENCOUNTER — Other Ambulatory Visit: Payer: Self-pay

## 2019-12-21 ENCOUNTER — Encounter: Payer: Self-pay | Admitting: Neurology

## 2019-12-21 ENCOUNTER — Ambulatory Visit (INDEPENDENT_AMBULATORY_CARE_PROVIDER_SITE_OTHER): Payer: Medicare HMO | Admitting: Neurology

## 2019-12-21 VITALS — BP 122/76 | HR 85 | Ht 65.0 in | Wt 172.0 lb

## 2019-12-21 DIAGNOSIS — G4733 Obstructive sleep apnea (adult) (pediatric): Secondary | ICD-10-CM

## 2019-12-21 DIAGNOSIS — Z789 Other specified health status: Secondary | ICD-10-CM | POA: Diagnosis not present

## 2019-12-21 NOTE — Progress Notes (Signed)
Subjective:    Patient ID: Zachary Henderson is a 64 y.o. male.  HPI     Zachary Foley, MD, PhD [facility] [address] [address] [place], [place] [address]  Dear Dr. Jenne Pane,   I saw your patient, Zachary Henderson, upon your kind request in my sleep clinic today for initial consultation of his obstructive sleep apnea, status post recent inspire hypoglossal nerve stimulator placement last month. The patient is unaccompanied today. As you know, Zachary Henderson is a 64 year old right-handed gentleman with an underlying medical history of asthma, allergies, diabetes, hypertension, hyperlipidemia, rheumatoid arthritis, thyroid disease, carpal tunnel syndrome and plantar fasciitis, who was diagnosed with obstructive sleep apnea several years ago.  He had sleep study testing in 2020 under Dr. Maple Hudson.  He was originally on CPAP and then on BiPAP therapy.  He had discomfort with the BiPAP, particularly difficulty tolerating the mask, he had issues with mask fit after teeth extractions.  He has dentures for the top.  He reports that he used to be compliant with his BiPAP.  He reports that he was diagnosed with severe obstructive sleep apnea, last sleep study was in December 2020, report is not available for my review today but he reports that he was confirmed to have severe sleep apnea.  He has not used his BiPAP in 8 or 9 months.  He had sleep endoscopy in May 2021 and had hypoglossal nerve stimulator placement on 11/11/2019.  His Epworth sleepiness score is 10/24,  Fatigue severity score is 37 out of 63.  He has chronic difficulty initiating and maintaining sleep, primarily going to sleep.  He currently takes amitriptyline for sleep.  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  He had recent carpal tunnel surgery and is supposed to have a right carpal tunnel surgery soon.  He lives in [place], [place], mom lives with him and his youngest daughter also lives with  him.  He is divorced for many years, he also has an older daughter.  He smokes about a pack per day, currently not in the process of quitting.  He has a history of alcohol abuse by self-report and goes to AA, does not drink any alcohol any longer, smokes marijuana up to twice a week.  He drinks caffeine in the form of coffee, about 40 ounces per day.  He has recuperated well after his inspire procedure, had his left carpal tunnel surgery about a month ago.  His Past Medical History Is Significant For: Past Medical History:  Diagnosis Date   Arthritis    RA   Asthma    Dry mouth    GERD (gastroesophageal reflux disease)    Heavy smoker    smokes 1pp/d   Hypertension    Hypothyroidism    Rheumatoid aortitis    Sleep apnea    does not wear CPAP   Wears dentures    top plate   Wears glasses     His Past Surgical History Is Significant For: Past Surgical History:  Procedure Laterality Date   COLONOSCOPY     DRUG INDUCED ENDOSCOPY N/A 08/15/2019   Procedure: DRUG INDUCED ENDOSCOPY;  Surgeon: Christia Reading, MD;  Location: [facility];  Service: ENT;  Laterality: N/A;   HERNIA REPAIR Bilateral    IMPLANTATION OF HYPOGLOSSAL NERVE STIMULATOR Right 11/11/2019   Procedure: IMPLANTATION OF HYPOGLOSSAL NERVE STIMULATOR;  Surgeon: Christia Reading, MD;  Location: [facility] OR;  Service: ENT;  Laterality: Right;  MULTIPLE TOOTH EXTRACTIONS     ROTATOR CUFF REPAIR Right    WISDOM TOOTH EXTRACTION      His Family History Is Significant For: No family history on file.  His Social History Is Significant For: Social History   Socioeconomic History   Marital status: Single    Spouse name: Not on file   Number of children: Not on file   Years of education: Not on file   Highest education level: Not on file  Occupational History   Not on file  Tobacco Use   Smoking status: Current Every Day Smoker    Packs/day: 1.00    Types: Cigarettes   Smokeless tobacco:  Never Used  Vaping Use   Vaping Use: Never used  Substance and Sexual Activity   Alcohol use: Not Currently   Drug use: Yes    Types: Marijuana    Comment:  " last use 2 weeks ago " 11/10/19   Sexual activity: Not on file  Other Topics Concern   Not on file  Social History Narrative   Not on file   Social Determinants of Health   Financial Resource Strain:    Difficulty of Paying Living Expenses: Not on file  Food Insecurity:    Worried About Programme researcher, broadcasting/film/video in the Last Year: Not on file   [employer] of Food in the Last Year: Not on file  Transportation Needs:    Lack of Transportation (Medical): Not on file   Lack of Transportation (Non-Medical): Not on file  Physical Activity:    Days of Exercise per Week: Not on file   Minutes of Exercise per Session: Not on file  Stress:    Feeling of Stress : Not on file  Social Connections:    Frequency of Communication with Friends and Family: Not on file   Frequency of Social Gatherings with Friends and Family: Not on file   Attends Religious Services: Not on file   Active Member of Clubs or Organizations: Not on file   Attends Banker Meetings: Not on file   Marital Status: Not on file    His Allergies Are:  Allergies  Allergen Reactions   Other     Bell peppers causes food poisoning  Cats shortness of breath, anaphylaxis     Onion Diarrhea and Nausea And Vomiting    Causes food poisoning    :   His Current Medications Are:  Outpatient Encounter Medications as of 12/21/2019  Medication Sig   acetaminophen (TYLENOL) 650 MG CR tablet Take 650-1,300 mg by mouth every 8 (eight) hours as needed for pain.    albuterol (VENTOLIN HFA) 108 (90 Base) MCG/ACT inhaler Inhale 1-2 puffs into the lungs every 6 (six) hours as needed for wheezing or shortness of breath.    amitriptyline (ELAVIL) 75 MG tablet Take 75 mg by mouth at bedtime.   Ascorbic Acid (VITAMIN C) 1000 MG tablet Take 1,000 mg by  mouth daily.   atorvastatin (LIPITOR) 20 MG tablet Take 20 mg by mouth daily.   baclofen (LIORESAL) 10 MG tablet Take 10 mg by mouth 2 (two) times daily.    celecoxib (CELEBREX) 200 MG capsule Take 200 mg by mouth 2 (two) times daily.   cholecalciferol (VITAMIN D3) 25 MCG (1000 UNIT) tablet Take 1,000 Units by mouth daily.   fexofenadine (ALLEGRA) 180 MG tablet Take 180 mg by mouth daily.   fluticasone (FLONASE) 50 MCG/ACT nasal spray Place 2 sprays into both nostrils daily.  Fluticasone-Salmeterol (ADVAIR) 250-50 MCG/DOSE AEPB Inhale 1 puff into the lungs 2 (two) times daily.   gabapentin (NEURONTIN) 300 MG capsule Take 300 mg by mouth 3 (three) times daily.   HYDROcodone-acetaminophen (NORCO/VICODIN) 5-325 MG tablet Take 1-2 tablets by mouth every 6 (six) hours as needed for moderate pain.   hydroxychloroquine (PLAQUENIL) 200 MG tablet Take 200 mg by mouth 2 (two) times daily.    levothyroxine (SYNTHROID) 88 MCG tablet Take 88 mcg by mouth daily before breakfast.   lisinopril (ZESTRIL) 10 MG tablet Take 10 mg by mouth daily.   montelukast (SINGULAIR) 10 MG tablet Take 10 mg by mouth at bedtime.   omeprazole (PRILOSEC) 40 MG capsule Take 40 mg by mouth daily.   predniSONE (DELTASONE) 2.5 MG tablet Take 2.5 mg by mouth daily with breakfast.   pseudoephedrine (SUDAFED) 120 MG 12 hr tablet Take 120 mg by mouth daily as needed for congestion.   rOPINIRole (REQUIP) 1 MG tablet Take 1 mg by mouth at bedtime.   SUPER B COMPLEX/C PO Take 1 tablet by mouth daily.   vitamin E 180 MG (400 UNITS) capsule Take 400 Units by mouth daily.   No facility-administered encounter medications on file as of 12/21/2019.  :  Review of Systems:  Out of a complete 14 point review of systems, all are reviewed and negative with the exception of these symptoms as listed below: Review of Systems  Neurological:       Here for sleep consult/inspire start.  Pt's inspire was placed on 11/11/2019.  Pt  reports he is ready to have his device activated. Epworth Sleepiness Scale 0= would never doze 1= slight chance of dozing 2= moderate chance of dozing 3= high chance of dozing  Sitting and reading:2 Watching TV:2 Sitting inactive in a public place (ex. Theater or meeting):0 As a passenger in a car for an hour without a break:2 Lying down to rest in the afternoon:3 Sitting and talking to someone:0 Sitting quietly after lunch (no alcohol):1 In a car, while stopped in traffic:0 Total:10     Objective:  Neurological Exam  Physical Exam Physical Examination:   Vitals:   12/21/19 1042  BP: 122/76  Pulse: 85  SpO2: 96%    General Examination: The patient is a very pleasant 64 y.o. male in no acute distress. He appears well-developed and well-nourished and well groomed.   HEENT: Normocephalic, atraumatic, pupils are equal, round and reactive to light, extraocular tracking is good without limitation to gaze excursion or nystagmus noted. Hearing is grossly intact. Face is symmetric with normal facial animation. Speech is clear with no dysarthria noted. There is no hypophonia. There is no lip, neck/head, jaw or voice tremor. Neck is supple with full range of passive and active motion. There are no carotid bruits on auscultation. Oropharynx exam reveals: mild mouth dryness, adequate dental hygiene, full dentures on top, moderate airway crowding secondary to redundant soft palate and small airway entry.  Tonsils not fully visualized.  Tongue protrudes centrally in palate elevates symmetrically, well-healing scar right below chin area.  Also well-healing scar in the right upper chest from inspire generator placement.   Chest: Clear to auscultation without wheezing, rhonchi or crackles noted.  Heart: S1+S2+0, regular and normal without murmurs, rubs or gallops noted.   Abdomen: Soft, non-tender and non-distended with normal bowel sounds appreciated on auscultation.  Extremities: There is  no edema in the distal lower extremities bilaterally.  Left wrist brace in place.  Skin: Warm and dry without  trophic changes noted.   Musculoskeletal: exam reveals no obvious joint deformities, tenderness or joint swelling or erythema.   Neurologically:  Mental status: The patient is awake, alert and oriented in all 4 spheres. His immediate and remote memory, attention, language skills and fund of knowledge are appropriate. There is no evidence of aphasia, agnosia, apraxia or anomia. Speech is clear with normal prosody and enunciation. Thought process is linear. Mood is normal and affect is normal.  Cranial nerves II - XII are as described above under HEENT exam.  Motor exam: Normal bulk, strength and tone is noted. There is no tremor, fine motor skills and coordination: grossly intact.  Left wrist in a brace. Cerebellar testing: No dysmetria or intention tremor. There is no truncal or gait ataxia.  Sensory exam: intact to light touch in the upper and lower extremities.  Gait, station and balance: He stands easily. No veering to one side is noted. No leaning to one side is noted. Posture is age-appropriate and stance is narrow based. Gait shows normal stride length and normal pace. No problems turning are noted.   Assessment and Plan:  In summary, TION BERHE is a very pleasant 64 y.o.-year old male with an underlying medical history of asthma, allergies, diabetes, hypertension, hyperlipidemia, rheumatoid arthritis, thyroid disease, carpal tunnel syndrome and plantar fasciitis, who presents for evaluation of his obstructive sleep apnea with history of CPAP and BiPAP intolerance and status post implantation of hypoglossal nerve stimulator on 11/11/2019.  He has recuperated well from the surgery and has his activation appointment tomorrow in our sleep lab.  He will be instructed on 2 use his remote control as well as how to titrate the initial settings.  We will plan on doing a inspire titration  study overnight for optimization of his treatment settings in about 90 days from the activation date and see him in follow-up after the sleep study.  He is advised to call us with any interim questions or concerns and we plan to see him tomorrow for his activation and will give him instructions at the time as well.  I answered all his questions today and he was in agreement with the plan.   Thank you very much for allowing me to participate in the care of this nice patient. If I can be of any further assistance to you please do not hesitate to call me at [phone].  Sincerely,   Zachary Foley, MD, PhD

## 2019-12-21 NOTE — Patient Instructions (Signed)
It was nice to meet you today. I am excited to start the Inspire journey with you!  As discussed, today's appointment was to get to know your sleep history and your examination and your medical history.  You will have your Inspire activation appointment tomorrow. Check in like you did today, and we will bring you into the sleep lab.   Tomorrow will be your initial activation of the Inspire device and you will be instructed as to how to turn the device on and off and how to adjust the initial settings at home, which will be your "homework" for the next ensuing weeks. We will then bring you in about 90 days later for an overnight sleep study for your "Inspire titration" to tweak your settings in the hope to reduce and eliminate your sleep apnea.  You will have a follow-up appointment in the office about a month after your sleep study. We will call you with the sleep study results.  I have ordered your overnight sleep study in your chart today.  You can always call or email [place] through MyChart if you have any interim questions or concerns.

## 2019-12-22 ENCOUNTER — Encounter: Payer: Self-pay | Admitting: Neurology

## 2019-12-22 ENCOUNTER — Ambulatory Visit (INDEPENDENT_AMBULATORY_CARE_PROVIDER_SITE_OTHER): Payer: Medicare HMO | Admitting: Neurology

## 2019-12-22 VITALS — BP 135/91 | HR 75 | Ht 65.0 in | Wt 174.0 lb

## 2019-12-22 DIAGNOSIS — Z789 Other specified health status: Secondary | ICD-10-CM

## 2019-12-22 DIAGNOSIS — G4733 Obstructive sleep apnea (adult) (pediatric): Secondary | ICD-10-CM

## 2019-12-22 NOTE — Progress Notes (Signed)
Subjective:    Patient ID: Zachary Henderson is a 64 y.o. male.  HPI     Interim history:   Zachary Henderson is a 64 year old right-handed gentleman with an underlying medical history of asthma, allergies, diabetes, hypertension, hyperlipidemia, rheumatoid arthritis, thyroid disease, carpal tunnel syndrome and plantar fasciitis, who Presents for initial inspire activation.  The patient is unaccompanied today.  I first met him yesterday for his initial evaluation at the request for Dr. Redmond Baseman.  The patient had a history of obstructive sleep apnea with CPAP and BiPAP intolerance.  He had inspire stimulator placement on 0/05/1113 without complications.    Today, 12/22/2019:   His breathing pattern looks good upon interrogation of the device.  His initial amplitude was set at 1.0 V with the final titration level of 1.8 V over 10 levels.  Start delay was kept at 30 minutes, pause time of 15 minutes, therapy duration at the default setting of 8 hours.  He does report that he sometimes has difficulty falling asleep.  He was advised to push the pause button to give him another 45 minutes if need be or he can turn the device off and on again which would reset the start delayed to 30 minutes.  The patient's allergies, current medications, family history, past medical history, past social history, past surgical history and problem list were reviewed and updated as appropriate.     Previously:   12/21/19: (He) was diagnosed with obstructive sleep apnea several years ago.  He had sleep study testing in 2020 under Dr. Ermalene Postin.  He was originally on CPAP and then on BiPAP therapy.  He had discomfort with the BiPAP, particularly difficulty tolerating the mask, he had issues with mask fit after teeth extractions.  He has dentures for the top.  He reports that he used to be compliant with his BiPAP.  He reports that he was diagnosed with severe obstructive sleep apnea, last sleep study was in December 2020, report is  not available for my review today but he reports that he was confirmed to have severe sleep apnea.  He has not used his BiPAP in 8 or 9 months.  He had sleep endoscopy in May 2021 and had hypoglossal nerve stimulator placement on 11/11/2019.  His Epworth sleepiness score is 10/24,  Fatigue severity score is 37 out of 63.  He has chronic difficulty initiating and maintaining sleep, primarily going to sleep.  He currently takes amitriptyline for sleep.  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  He had recent carpal tunnel surgery and is supposed to have a right carpal tunnel surgery soon.  He lives in [place], [place], mom lives with him and his youngest daughter also lives with him.  He is divorced for many years, he also has an older daughter.  He smokes about a pack per day, currently not in the process of quitting.  He has a history of alcohol abuse by self-report and goes to AA, does not drink any alcohol any longer, smokes marijuana up to twice a week.  He drinks caffeine in the form of coffee, about 40 ounces per day.  He has recuperated well after his inspire procedure, had his left carpal tunnel surgery about a month ago.  His Past Medical History Is Significant For: Past Medical History:  Diagnosis Date  . Arthritis    RA  . Asthma   . Dry mouth   . GERD (gastroesophageal reflux disease)   . Heavy smoker  smokes 1pp/d  . Hypertension   . Hypothyroidism   . Rheumatoid aortitis   . Sleep apnea    does not wear CPAP  . Wears dentures    top plate  . Wears glasses     His Past Surgical History Is Significant For: Past Surgical History:  Procedure Laterality Date  . COLONOSCOPY    . DRUG INDUCED ENDOSCOPY N/A 08/15/2019   Procedure: DRUG INDUCED ENDOSCOPY;  Surgeon: Melida Quitter, MD;  Location: [facility];  Service: ENT;  Laterality: N/A;  . HERNIA REPAIR Bilateral   . IMPLANTATION OF HYPOGLOSSAL NERVE STIMULATOR Right 11/11/2019   Procedure:  IMPLANTATION OF HYPOGLOSSAL NERVE STIMULATOR;  Surgeon: Melida Quitter, MD;  Location: [facility];  Service: ENT;  Laterality: Right;  . MULTIPLE TOOTH EXTRACTIONS    . ROTATOR CUFF REPAIR Right   . WISDOM TOOTH EXTRACTION      His Family History Is Significant For: No family history on file.  His Social History Is Significant For: Social History   Socioeconomic History  . Marital status: Single    Spouse name: Not on file  . Number of children: Not on file  . Years of education: Not on file  . Highest education level: Not on file  Occupational History  . Not on file  Tobacco Use  . Smoking status: Current Every Day Smoker    Packs/day: 1.00    Types: Cigarettes  . Smokeless tobacco: Never Used  Vaping Use  . Vaping Use: Never used  Substance and Sexual Activity  . Alcohol use: Not Currently  . Drug use: Yes    Types: Marijuana    Comment:  " last use 2 weeks ago " 11/10/19  . Sexual activity: Not on file  Other Topics Concern  . Not on file  Social History Narrative  . Not on file   Social Determinants of Health   Financial Resource Strain:   . Difficulty of Paying Living Expenses: Not on file  Food Insecurity:   . Worried About Charity fundraiser in the Last Year: Not on file  . Ran Out of Food in the Last Year: Not on file  Transportation Needs:   . Lack of Transportation (Medical): Not on file  . Lack of Transportation (Non-Medical): Not on file  Physical Activity:   . Days of Exercise per Week: Not on file  . Minutes of Exercise per Session: Not on file  Stress:   . Feeling of Stress : Not on file  Social Connections:   . Frequency of Communication with Friends and Family: Not on file  . Frequency of Social Gatherings with Friends and Family: Not on file  . Attends Religious Services: Not on file  . Active Member of Clubs or Organizations: Not on file  . Attends Archivist Meetings: Not on file  . Marital Status: Not on file    His Allergies Are:   Allergies  Allergen Reactions  . Other     Bell peppers causes food poisoning  Cats shortness of breath, anaphylaxis    . Onion Diarrhea and Nausea And Vomiting    Causes food poisoning    :   His Current Medications Are:  Outpatient Encounter Medications as of 12/22/2019  Medication Sig  . acetaminophen (TYLENOL) 650 MG CR tablet Take 650-1,300 mg by mouth every 8 (eight) hours as needed for pain.   Marland Kitchen albuterol (VENTOLIN HFA) 108 (90 Base) MCG/ACT inhaler Inhale 1-2 puffs into the lungs  every 6 (six) hours as needed for wheezing or shortness of breath.   Marland Kitchen amitriptyline (ELAVIL) 75 MG tablet Take 75 mg by mouth at bedtime.  . Ascorbic Acid (VITAMIN C) 1000 MG tablet Take 1,000 mg by mouth daily.  Marland Kitchen atorvastatin (LIPITOR) 20 MG tablet Take 20 mg by mouth daily.  . baclofen (LIORESAL) 10 MG tablet Take 10 mg by mouth 2 (two) times daily.   . celecoxib (CELEBREX) 200 MG capsule Take 200 mg by mouth 2 (two) times daily.  . cholecalciferol (VITAMIN D3) 25 MCG (1000 UNIT) tablet Take 1,000 Units by mouth daily.  . fexofenadine (ALLEGRA) 180 MG tablet Take 180 mg by mouth daily.  . fluticasone (FLONASE) 50 MCG/ACT nasal spray Place 2 sprays into both nostrils daily.   . Fluticasone-Salmeterol (ADVAIR) 250-50 MCG/DOSE AEPB Inhale 1 puff into the lungs 2 (two) times daily.  Marland Kitchen gabapentin (NEURONTIN) 300 MG capsule Take 300 mg by mouth 3 (three) times daily.  Marland Kitchen HYDROcodone-acetaminophen (NORCO/VICODIN) 5-325 MG tablet Take 1-2 tablets by mouth every 6 (six) hours as needed for moderate pain.  . hydroxychloroquine (PLAQUENIL) 200 MG tablet Take 200 mg by mouth 2 (two) times daily.   Marland Kitchen levothyroxine (SYNTHROID) 88 MCG tablet Take 88 mcg by mouth daily before breakfast.  . lisinopril (ZESTRIL) 10 MG tablet Take 10 mg by mouth daily.  . montelukast (SINGULAIR) 10 MG tablet Take 10 mg by mouth at bedtime.  Marland Kitchen omeprazole (PRILOSEC) 40 MG capsule Take 40 mg by mouth daily.  . predniSONE (DELTASONE)  2.5 MG tablet Take 2.5 mg by mouth daily with breakfast.  . pseudoephedrine (SUDAFED) 120 MG 12 hr tablet Take 120 mg by mouth daily as needed for congestion.  Marland Kitchen rOPINIRole (REQUIP) 1 MG tablet Take 1 mg by mouth at bedtime.  . SUPER B COMPLEX/C PO Take 1 tablet by mouth daily.  . vitamin E 180 MG (400 UNITS) capsule Take 400 Units by mouth daily.   No facility-administered encounter medications on file as of 12/22/2019.  :  Review of Systems:  Out of a complete 14 point review of systems, all are reviewed and negative with the exception of these symptoms as listed below: Review of Systems  Neurological:       Here for f/u on inspire and to complete activation.     Objective:  Neurological Exam  Physical Exam Physical Examination:   Vitals:   12/22/19 0735  BP: (!) 135/91  Pulse: 75   General Examination: The patient is a very pleasant 64 y.o. male in no acute distress. He appears well-developed and well-nourished and well groomed.   HEENT: Extraocular tracking is well preserved, hearing is grossly intact. Face is symmetric with normal facial animation. Speech is clear with no dysarthria noted. There is no hypophonia. There is no lip, neck/head, jaw or voice tremor. Neck shows FROM. Well-healing scar on right, below chin area.   Chest: unlabored breathing.   Heart: not reexamined today.   Abdomen: Non-distended.  Extremities: There is no obvious edema.  Skin: Warm and dry without trophic changes noted.   Musculoskeletal: exam reveals no obvious joint deformities. Some motor restlessness noted in the legs.   Neurologically:  Mental status: The patient is awake, alert and oriented in all 4 spheres. His immediate and remote memory, attention, language skills and fund of knowledge are appropriate. There is no evidence of aphasia, agnosia, apraxia or anomia. Speech is clear with normal prosody and enunciation. Thought process is linear. Mood is normal  and affect is  normal.  Cranial nerves II - XII are as described above under HEENT exam.  Motor exam: moving all 4 extremities well.  Gait, station and balance: He stands easily. No veering to one side is noted. No leaning to one side is noted. Posture is age-appropriate and stance is narrow based. Gait shows normal stride length and normal pace. No problems turning are noted.   Assessment and Plan:  In summary, Zachary Henderson is a very pleasant 64 year old male with an underlying medical history of asthma, allergies, diabetes, hypertension, hyperlipidemia, rheumatoid arthritis, thyroid disease, carpal tunnel syndrome and plantar fasciitis, who presents for initial activation of his inspire hypoglossal nerve stimulator.  He has a history of CPAP and BiPAP intolerance and status post implantation of hypoglossal nerve stimulator on 11/11/2019.  He has recuperated well from the surgery. His breathing pattern looks good upon interrogation of the device.  His initial amplitude was set at 1.0 V with the final titration level of 1.8 V over 10 levels.  Start delay was kept at 30 minutes, pause time of 15 minutes, therapy duration at the default setting of 8 hours.  He does report that he sometimes has difficulty falling asleep.  He was advised to push the pause button to give him another 45 minutes if need be or he can turn the device off and on again which would reset the start delayed to 30 minutes.  He is scheduled for inspire titration overnight sleep study in our lab in December with a follow-up appointment in January 2022.  He will have a brief clinic appointment in November to review his settings with a download.  He was instructed as to how to use his remote and how to increase his settings gradually until his titration appointment.  He was advised to call or email [place] through [facility] with any interim questions or concerns and was also given the 24-7 customer service number, the instruction booklet and our direct phone  number for the sleep lab. I answered all his questions today and he was in agreement with the plan.

## 2019-12-22 NOTE — Patient Instructions (Addendum)
It was good to see you again today. We are excited to start the "Inspire-Journey" with you. We are here for you every step of the way! I am glad you have done well after surgery. As explained, you can start increasing the stimulator setting. Every few days (3-5 approx.), you can increase the level 1 more time.  You have been scheduled for your overnight sleep study for Inspire-Titration on 03/20/20. We will call you with the results. We will arrange for a follow-up in the clinic about 1 month after your sleep study. We will reach out to you in the next couple of weeks by phone to see how you are doing. Please feel free to call us back for any questions or an update; you can also email [place] through MyChart. I would encourage you to sign up for MyChart, if you have not done so yet. For your next appointment, please bring your remote.

## 2020-01-24 ENCOUNTER — Telehealth: Payer: Self-pay

## 2020-01-24 NOTE — Telephone Encounter (Signed)
Called patient to check in and see how his Zachary Henderson is going. He is at level 6. Did increase to level 7 but decreased back to level 6 for a few nights. He is increasing again to level 7 tonight. He feels better and has adjusted to the tongue movement. He had not questions at this time. I will check back in prior to his sleep study in December. He will call if any questions come up.

## 2020-03-06 ENCOUNTER — Encounter: Payer: Self-pay | Admitting: Neurology

## 2020-03-06 ENCOUNTER — Ambulatory Visit (INDEPENDENT_AMBULATORY_CARE_PROVIDER_SITE_OTHER): Payer: Medicare HMO | Admitting: Neurology

## 2020-03-06 VITALS — BP 126/87 | HR 75 | Ht 65.0 in | Wt 177.0 lb

## 2020-03-06 DIAGNOSIS — Z9682 Presence of neurostimulator: Secondary | ICD-10-CM

## 2020-03-06 DIAGNOSIS — G4733 Obstructive sleep apnea (adult) (pediatric): Secondary | ICD-10-CM | POA: Diagnosis not present

## 2020-03-06 DIAGNOSIS — Z789 Other specified health status: Secondary | ICD-10-CM | POA: Diagnosis not present

## 2020-03-06 NOTE — Patient Instructions (Signed)
It was good to see you again today.  You are turning your inspire device on almost every night, 95% of the time which is great but you to not always keep it on overnight.  Please try to make sure you turn the unit back on when you turn it off in the middle of the night.  The more you use it the better you will get used to it.  Your average usage is not quite 4 hours yet.  We have you scheduled for a titration study overnight next month and we will have a follow-up appointment afterwards to adjust your settings better.

## 2020-03-06 NOTE — Progress Notes (Signed)
Subjective:    Patient ID: Zachary Henderson is a 64 y.o. male.  HPI     Interim history:   Zachary Henderson is a 64 year old right-handed gentleman with an underlying medical history of asthma, allergies, diabetes, hypertension, hyperlipidemia, rheumatoid arthritis, thyroid disease, carpal tunnel syndrome and plantar fasciitis, who presents for follow-up consultation of his obstructive sleep apnea, for a inspire recheck.  The patient is unaccompanied today.  I last saw him on 12/22/2019 for his inspire activation, at which time his amplitude was set at 1 V with a final titration level of 1.8 V over 10 levels.  Start delay was 30 minutes, pause time 15 minutes.  Today, 03/06/2020: He reports that he has some neck discomfort but no pain.  He has healed well.  He has some trouble sleeping on his back which is his preferred sleep position but his sleep apnea is still apparent to him.  Lately, he had a stuffy nose and allergy flareup.  He is on allergy medication including generic Singulair and Flonase as well as Claritin.  I reviewed his inspire compliance download from 12/22/2019 through 03/06/2020.  He used his inspire 39 out of 75 days but days used more than 4 hours was only 36%, which is 27 out of 75 days, average usage of 3.2 hours.  He does report that he sometimes falls asleep without turning it on or if he turned it on and could not fall asleep he turns it back off and sometimes forgets to turn it back on.  He also reports that he is not sleeping well through the night and when he wakes up in the middle of the night and turns the unit off he will sometimes forget to turn it back on.  He is motivated to continue working on it.  Incoming amplitude is 1.4, he reports that he was able to get up to level 7.   The patient's allergies, current medications, family history, past medical history, past social history, past surgical history and problem list were reviewed and updated as appropriate.     Previously:   I first met him on 12/21/19 for his initial evaluation at the request for Dr. Redmond Baseman.  The patient had a history of obstructive sleep apnea with CPAP and BiPAP intolerance.  He had inspire stimulator placement on 10/13/3901 without complications.    12/21/19: (He) was diagnosed with obstructive sleep apnea several years ago.  He had sleep study testing in 2020 under Dr. Ermalene Postin.  He was originally on CPAP and then on BiPAP therapy.  He had discomfort with the BiPAP, particularly difficulty tolerating the mask, he had issues with mask fit after teeth extractions.  He has dentures for the top.  He reports that he used to be compliant with his BiPAP.  He reports that he was diagnosed with severe obstructive sleep apnea, last sleep study was in December 2020, report is not available for my review today but he reports that he was confirmed to have severe sleep apnea.  He has not used his BiPAP in 8 or 9 months.  He had sleep endoscopy in May 2021 and had hypoglossal nerve stimulator placement on 11/11/2019.  His Epworth sleepiness score is 10/24,  Fatigue severity score is 37 out of 63.  He has chronic difficulty initiating and maintaining sleep, primarily going to sleep.  He currently takes amitriptyline for sleep.  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  He had recent carpal tunnel surgery and is  supposed to have a right carpal tunnel surgery soon.  He lives in [place], [place], mom lives with him and his youngest daughter also lives with him.  He is divorced for many years, he also has an older daughter.  He smokes about a pack per day, currently not in the process of quitting.  He has a history of alcohol abuse by self-report and goes to AA, does not drink any alcohol any longer, smokes marijuana up to twice a week.  He drinks caffeine in the form of coffee, about 40 ounces per day.  He has recuperated well after his inspire procedure, had his left carpal tunnel surgery about a  month ago.   His Past Medical History Is Significant For: Past Medical History:  Diagnosis Date  . Arthritis    RA  . Asthma   . Dry mouth   . GERD (gastroesophageal reflux disease)   . Heavy smoker    smokes 1pp/d  . Hypertension   . Hypothyroidism   . Rheumatoid aortitis   . Sleep apnea    does not wear CPAP  . Wears dentures    top plate  . Wears glasses     His Past Surgical History Is Significant For: Past Surgical History:  Procedure Laterality Date  . COLONOSCOPY    . DRUG INDUCED ENDOSCOPY N/A 08/15/2019   Procedure: DRUG INDUCED ENDOSCOPY;  Surgeon: Melida Quitter, MD;  Location: [facility];  Service: ENT;  Laterality: N/A;  . HERNIA REPAIR Bilateral   . IMPLANTATION OF HYPOGLOSSAL NERVE STIMULATOR Right 11/11/2019   Procedure: IMPLANTATION OF HYPOGLOSSAL NERVE STIMULATOR;  Surgeon: Melida Quitter, MD;  Location: [facility];  Service: ENT;  Laterality: Right;  . MULTIPLE TOOTH EXTRACTIONS    . ROTATOR CUFF REPAIR Right   . WISDOM TOOTH EXTRACTION      His Family History Is Significant For: No family history on file.  His Social History Is Significant For: Social History   Socioeconomic History  . Marital status: Single    Spouse name: Not on file  . Number of children: Not on file  . Years of education: Not on file  . Highest education level: Not on file  Occupational History  . Not on file  Tobacco Use  . Smoking status: Current Every Day Smoker    Packs/day: 1.00    Types: Cigarettes  . Smokeless tobacco: Never Used  Vaping Use  . Vaping Use: Never used  Substance and Sexual Activity  . Alcohol use: Not Currently  . Drug use: Yes    Types: Marijuana    Comment:  " last use 2 weeks ago " 11/10/19  . Sexual activity: Not on file  Other Topics Concern  . Not on file  Social History Narrative  . Not on file   Social Determinants of Health   Financial Resource Strain:   . Difficulty of Paying Living Expenses: Not on file  Food  Insecurity:   . Worried About Charity fundraiser in the Last Year: Not on file  . Ran Out of Food in the Last Year: Not on file  Transportation Needs:   . Lack of Transportation (Medical): Not on file  . Lack of Transportation (Non-Medical): Not on file  Physical Activity:   . Days of Exercise per Week: Not on file  . Minutes of Exercise per Session: Not on file  Stress:   . Feeling of Stress : Not on file  Social Connections:   .  Frequency of Communication with Friends and Family: Not on file  . Frequency of Social Gatherings with Friends and Family: Not on file  . Attends Religious Services: Not on file  . Active Member of Clubs or Organizations: Not on file  . Attends Archivist Meetings: Not on file  . Marital Status: Not on file    His Allergies Are:  Allergies  Allergen Reactions  . Other     Bell peppers causes food poisoning  Cats shortness of breath, anaphylaxis    . Onion Diarrhea and Nausea And Vomiting    Causes food poisoning    :   His Current Medications Are:  Outpatient Encounter Medications as of 03/06/2020  Medication Sig  . acetaminophen (TYLENOL) 650 MG CR tablet Take 650-1,300 mg by mouth every 8 (eight) hours as needed for pain.   Marland Kitchen albuterol (VENTOLIN HFA) 108 (90 Base) MCG/ACT inhaler Inhale 1-2 puffs into the lungs every 6 (six) hours as needed for wheezing or shortness of breath.   Marland Kitchen amitriptyline (ELAVIL) 75 MG tablet Take 75 mg by mouth at bedtime.  . Ascorbic Acid (VITAMIN C) 1000 MG tablet Take 1,000 mg by mouth daily.  Marland Kitchen atorvastatin (LIPITOR) 20 MG tablet Take 20 mg by mouth daily.  . baclofen (LIORESAL) 10 MG tablet Take 10 mg by mouth 2 (two) times daily.   . celecoxib (CELEBREX) 200 MG capsule Take 200 mg by mouth 2 (two) times daily.  . cholecalciferol (VITAMIN D3) 25 MCG (1000 UNIT) tablet Take 1,000 Units by mouth daily.  . fexofenadine (ALLEGRA) 180 MG tablet Take 180 mg by mouth daily.  . fluticasone (FLONASE) 50 MCG/ACT  nasal spray Place 2 sprays into both nostrils daily.   . Fluticasone-Salmeterol (ADVAIR) 250-50 MCG/DOSE AEPB Inhale 1 puff into the lungs 2 (two) times daily.  Marland Kitchen gabapentin (NEURONTIN) 300 MG capsule Take 300 mg by mouth 3 (three) times daily.  Marland Kitchen HYDROcodone-acetaminophen (NORCO/VICODIN) 5-325 MG tablet Take 1-2 tablets by mouth every 6 (six) hours as needed for moderate pain.  . hydroxychloroquine (PLAQUENIL) 200 MG tablet Take 200 mg by mouth 2 (two) times daily.   Marland Kitchen levothyroxine (SYNTHROID) 88 MCG tablet Take 88 mcg by mouth daily before breakfast.  . lisinopril (ZESTRIL) 10 MG tablet Take 10 mg by mouth daily.  . montelukast (SINGULAIR) 10 MG tablet Take 10 mg by mouth at bedtime.  Marland Kitchen omeprazole (PRILOSEC) 40 MG capsule Take 40 mg by mouth daily.  . predniSONE (DELTASONE) 2.5 MG tablet Take 2.5 mg by mouth daily with breakfast.  . pseudoephedrine (SUDAFED) 120 MG 12 hr tablet Take 120 mg by mouth daily as needed for congestion.  Marland Kitchen rOPINIRole (REQUIP) 1 MG tablet Take 1 mg by mouth at bedtime.  . SUPER B COMPLEX/C PO Take 1 tablet by mouth daily.  . vitamin E 180 MG (400 UNITS) capsule Take 400 Units by mouth daily.   No facility-administered encounter medications on file as of 03/06/2020.  :  Review of Systems:  Out of a complete 14 point review of systems, all are reviewed and negative with the exception of these symptoms as listed below: Review of Systems  Neurological:       Pt presents today to discuss his Inspire. He is complaining of not being able to sleep on his back. He is also complaining of neck pain/soreness.    Objective:  Neurological Exam  Physical Exam Physical Examination:   Vitals:   03/06/20 1033  BP: 126/87  Pulse: 75  General Examination: The patient is a very pleasant 64 y.o. male in no acute distress. He appears well-developed and well-nourished and well groomed.   HEENT:Extraocular tracking is well preserved, hearing is grossly intact. Face is  symmetric with normal facial animation. Speech is clear with no dysarthria noted. There is no hypophonia. There is no lip, neck/head, jaw or voice tremor. Neck shows FROM. Well-healed scar on right, below chin area.   Chest:CTA   Heart:Normal heart sounds without murmurs.   Abdomen:Non-distended.  Extremities:There isnoobvious edema.  Skin: Warm and dry without trophic changes noted.   Musculoskeletal: exam reveals no obvious joint deformities. Some motor restlessness noted in the legs.  Stable.  Neurologically:  Mental status: The patient is awake, alert and oriented in all 4 spheres.Hisimmediate and remote memory, attention, language skills and fund of knowledge are appropriate. There is no evidence of aphasia, agnosia, apraxia or anomia. Speech is clear with normal prosody and enunciation. Thought process is linear. Mood is normaland affect is normal.  Cranial nerves II - XII are as described above under HEENT exam.  Motor exam: moving all 4 extremities well.  Gait, station and balance:Hestands easily. No veering to one side is noted. No leaning to one side is noted. Posture is age-appropriate and stance is narrow based. Gait showsnormalstride length and normalpace. No problems turning are noted.   Assessmentand Plan:  In summary,Zachary L Blackwellis a very pleasant 66 year oldmalewith an underlying medical history of asthma, allergies, diabetes, hypertension, hyperlipidemia, rheumatoid arthritis, thyroid disease, carpal tunnel syndrome and plantar fasciitis, and OSA with CPAP intolerance, whopresents for  follow-up appointment after initial inspire activation in September 2021.  He has been able to increase his treatment level to 1.4 V.  He is advised to stay at this level and work on maintaining treatment overnight.  He has done a very good job turning the unit on each night but he is not using it for more than 4 hours consistently.  His average usage is 3.2 hours  in the past 2-1/2 months.  He is motivated to continue with treatment and try to turn the unit back on in the middle of the night.  He is advised to keep his appointment for a formal titration study next month and he has an appointment 2 weeks after his sleep study already scheduled.  Of note, he has a history of obstructive sleep apnea with CPAP and BiPAP intolerance.  He is status post Inspire/hypoglossal nerve stimulator placement on 11/11/2019.  On 12/22/2019, his initial amplitude was set at 1.0 V with the final titration level of 1.8 V over 10 levels.  Start delay was kept at 30 minutes, pause time of 15 minutes, therapy duration at the default setting of 8 hours.  He continues to have difficulty maintaining sleep.  He is advised to stay of 1.4 V at this time and follow-up after his sleep study which is already scheduled for 03/20/2020, follow-up on 04/23/2020. I answered all his questions today and he was in agreement with the plan.  I spent 20 minutes in total face-to-face time and in reviewing records during pre-charting, more than 50% of which was spent in counseling and coordination of care, reviewing test results, reviewing medications and treatment regimen and/or in discussing or reviewing the diagnosis of OSA, the prognosis and treatment options. Pertinent laboratory and imaging test results that were available during this visit with the patient were reviewed by me and considered in my medical decision making (see chart for details).

## 2020-03-20 ENCOUNTER — Ambulatory Visit (INDEPENDENT_AMBULATORY_CARE_PROVIDER_SITE_OTHER): Payer: Medicare HMO | Admitting: Neurology

## 2020-03-20 DIAGNOSIS — Z789 Other specified health status: Secondary | ICD-10-CM

## 2020-03-20 DIAGNOSIS — Z9682 Presence of neurostimulator: Secondary | ICD-10-CM

## 2020-03-20 DIAGNOSIS — G4733 Obstructive sleep apnea (adult) (pediatric): Secondary | ICD-10-CM

## 2020-03-28 NOTE — Procedures (Signed)
PATIENTS NAME:  Zachary Henderson, Zachary Henderson DOB:      05-26-1955      MR#:    [id number]     DATE OF RECORDING: 03/20/2020 REFERRING M.D.:  Dr. Christia Reading  Study Performed:  Inspire Titration HISTORY: 64 year old man with a history of asthma, allergies, diabetes, hypertension, hyperlipidemia, rheumatoid arthritis, thyroid disease, carpal tunnel syndrome and plantar fasciitis, who presents for his Inspire titration study. He had his Inspire/hypoglossal nerve stimulator placement on 11/11/2019, activation on 12/22/2019, with initial amplitude set at 1.0 V, and goal of 1.8 V over 10 levels. Start delay was kept at 30 minutes, pause time of 15 minutes, therapy duration at the default setting of 8 hours. The patient endorsed the Epworth Sleepiness Scale at 10 points. The patients weight 172 pounds with a height of 65 (inches), resulting in a BMI of 28.7 kg/m2. The patients neck circumference measured 14.5 inches.  CURRENT MEDICATIONS: Tylenol, Ventolin, Elavil, Lipitor, Lioresal, Celebrex, Allegra, Flonase, Advair, Neurontin, Norco, Plaquenil, Zestril, Synthroid, Singulair, Prilosec, Deltasone, Sudafed, Requip   PROCEDURE:  This is a multichannel digital polysomnogram utilizing the SomnoStar 11.2 system.  Electrodes and sensors were applied and monitored per AASM Specifications.   EEG, EOG, Chin and Limb EMG, were sampled at 200 Hz.  ECG, Snore and Nasal Pressure, Thermal Airflow, Respiratory Effort, CPAP Flow and Pressure, Oximetry was sampled at 50 Hz. Digital video and audio were recorded.      The patient has been using his inspire approximately 20 hours/week.  He does have a subjective benefit but has not been using it for more than 4 hours per night consistently.  His incoming amplitude was 1.4 V.  Therapeutic amplitude was found to be around 1 V, AHI was estimated to be 0/h.  Zachary Henderson was turned on during this titration with a starting amplitude of 0.2 V below the incoming amplitude, i.e. 1.2 V.  By the end of the  titration his incoming amplitude was reset to 1.4 V, lower limit programmed to be 0.2 V below the incoming amplitude and the upper limit was programmed to be 1.0 V above the lower limit.  He was advised that the final settings would be programmed after the sleep study and at the time of his follow-up appointment which is pending.  The patient was noted to have some difficulty maintaining sleep and initiating sleep.  He did have some difficulty with tolerance and requested to pause at least 2 times during this titration.  Lights Out was at 22:24 and Lights On at 05:10. Total recording time (TRT) was 368 minutes, with a total sleep time (TST) of 295.5 minutes. The patients sleep latency was 12 minutes. REM latency was 33 minutes.  The sleep efficiency was 80.3 %.    SLEEP ARCHITECTURE: WASO (Wake after sleep onset) was 72.5 minutes with one longer period of wakefulness. There were 16 minutes in Stage N1, 76 minutes Stage N2, 156 minutes Stage N3 and 54.5 minutes in Stage REM.  The percentage of Stage N1 was 5.3%, Stage N2 was 25.1%, Stage N3 was 51.6%, which is increased, and Stage R (REM sleep) was 18.%, which is near normal. The arousals were noted as: 18 were spontaneous, 1 were associated with PLMs, 2 were associated with respiratory events.  RESPIRATORY ANALYSIS:  There was a total of 2 respiratory events: 0 obstructive apneas, 0 central apneas and 0 mixed apneas with a total of 0 apneas and an apnea index (AI) of 0 /hour. There were 2 hypopneas with a hypopnea  index of .4/hour. The patient also had 0 respiratory event related arousals (RERAs).      The total APNEA/HYPOPNEA INDEX  (AHI) was .4 /hour and the total RESPIRATORY DISTURBANCE INDEX was .4 /hour  0 events occurred in REM sleep and 2 events in NREM. The REM AHI was 0 /hour versus a non-REM AHI of .5 /hour.  The patient spent 302.5 minutes of total sleep time in the supine position and 0 minutes in non-supine. The supine AHI was 0.4/hour, versus  a non-supine AHI of 0.0/hour.  OXYGEN SATURATION & C02:  The baseline 02 saturation was 94%, with the lowest being 91%. Time spent below 89% saturation equaled 0 minutes.  PERIODIC LIMB MOVEMENTS:  The patient had a total of 15 Periodic Limb Movements. The Periodic Limb Movement (PLM) index was 3./hour and the PLM Arousal index was .2 /hour.  Audio and video analysis did not show any abnormal or unusual movements, behaviors, phonations or vocalizations. The patient took 1 bathroom break. The EKG was in keeping with normal sinus rhythm (NSR).  Post-study, the patient indicated that sleep was better than usual.   DIAGNOSIS 1. OSA - Obstructive Sleep Apnea  2. CPAP intolerance 3. Inspire Stimulator placement  PLANS/RECOMMENDATIONS: 1. His incoming amplitude was 1.4 V.  Therapeutic amplitude was found to be around 1 V, AHI was estimated to be 0/h.  Zachary Henderson was turned on during this titration with a starting amplitude of 0.2 V below the incoming amplitude, i.e. 1.2 V.  By the end of the titration his incoming amplitude was reset to 1.4 V, lower limit programmed to be 0.2 V below the incoming amplitude and the upper limit was programmed to be 1.0 V above the lower limit.  He was advised that the final settings would be programmed after the sleep study and at the time of his follow-up appointment which is pending.  The patient was noted to have some difficulty maintaining sleep and initiating sleep.  He did have some difficulty with tolerance and requested to pause at least 2 times during this titration. He may be able to stay around 1.4 V for now.  2. The patient should be cautioned not to drive, work at heights, or operate dangerous or heavy equipment when tired or sleepy. Review and reiteration of good sleep hygiene measures should be pursued with any patient. 3. The patient will be seen in follow-up in the sleep clinic at [facility] for discussion of the test results, symptom and treatment compliance review,  further management strategies, etc. The referring provider will be notified of the test results.  I certify that I have reviewed the entire raw data recording prior to the issuance of this report in accordance with the Standards of Accreditation of the American Academy of Sleep Medicine (AASM)  Huston Foley, MD, PhD Diplomat, American Board of Neurology and Sleep Medicine (Neurology and Sleep Medicine)

## 2020-03-28 NOTE — Progress Notes (Signed)
The patient was referred by Dr. Jenne Pane.  He had his implantation of inspire nerve stimulator on 11/11/2019 and activation with [place] on 12/22/2019.  He had a titration study on 03/20/2020.  He has not been using his inspire because consistently more than 4 hours per night.  His current setting of 1.4 V may be enough to treat his sleep apnea for now, we will discuss further during his follow-up appointment, which is scheduled for the 17th of next month.  Please call patient and encourage him to use his inspire consistently each night so we have a better feel by the time of the follow-up appointment as to the usage, his tolerance and benefit and residual average apnea score.

## 2020-04-02 ENCOUNTER — Telehealth: Payer: Self-pay | Admitting: Neurology

## 2020-04-02 NOTE — Telephone Encounter (Signed)
Called the patient and reviewed the sleep study findings with the inspire device. Advised the patient that the inspire device treated his apnea at 1.0 V. Advised that once the patient comes in for his next visit on 17 th. We will make the appropriate changes. Pt verbalized understanding. Pt had no questions at this time but was encouraged to call back if questions arise.

## 2020-04-02 NOTE — Telephone Encounter (Signed)
-----   Message from Huston Foley, MD sent at 03/28/2020 12:33 PM EST ----- The patient was referred by Dr. Jenne Pane.  He had his implantation of inspire nerve stimulator on 11/11/2019 and activation with [place] on 12/22/2019.  He had a titration study on 03/20/2020.  He has not been using his inspire because consistently more than 4 hours per night.  His current setting of 1.4 V may be enough to treat his sleep apnea for now, we will discuss further during his follow-up appointment, which is scheduled for the 17th of next month.  Please call patient and encourage him to use his inspire consistently each night so we have a better feel by the time of the follow-up appointment as to the usage, his tolerance and benefit and residual average apnea score.

## 2020-04-23 ENCOUNTER — Ambulatory Visit: Payer: Medicare HMO | Admitting: Neurology

## 2020-04-24 ENCOUNTER — Telehealth: Payer: Self-pay

## 2020-04-24 NOTE — Telephone Encounter (Signed)
I spoke with Robin.  She will reschedule patient for inspire follow-up.

## 2020-04-25 ENCOUNTER — Ambulatory Visit: Payer: Medicare HMO | Admitting: Neurology

## 2020-04-25 ENCOUNTER — Encounter: Payer: Self-pay | Admitting: Neurology

## 2020-04-25 VITALS — BP 118/82 | HR 89 | Ht 65.0 in | Wt 176.3 lb

## 2020-04-25 DIAGNOSIS — Z9682 Presence of neurostimulator: Secondary | ICD-10-CM | POA: Diagnosis not present

## 2020-04-25 DIAGNOSIS — G4733 Obstructive sleep apnea (adult) (pediatric): Secondary | ICD-10-CM | POA: Diagnosis not present

## 2020-04-25 DIAGNOSIS — Z789 Other specified health status: Secondary | ICD-10-CM | POA: Diagnosis not present

## 2020-04-25 NOTE — Progress Notes (Signed)
Subjective:    Patient ID: Zachary Henderson is a 65 y.o. male.  HPI     Interim history:   Zachary Henderson is a 65 year old right-handed gentleman with an underlying medical history of asthma, allergies, diabetes, hypertension, hyperlipidemia, rheumatoid arthritis, thyroid disease, carpal tunnel syndrome and plantar fasciitis, who presents for follow-up consultation of his obstructive sleep apnea, for a inspire recheck, after his titration study.  The patient is unaccompanied today. The Inspire rep assists with the appointment. I last saw him on 03/06/2020, at which time he Reported some neck discomfort but no pain.  He noticed recent allergy flareup but was using medication for this.  He had an interim titration study on 03/20/2020, at which time his therapeutic amplitude was found to be around 1 V, AHI was estimated to be around 0/h.  His incoming voltage was 1.4.  He did have some difficulty maintaining sleep and also initiating sleep.  He has been using his inspire about 20 hours/week with subjective benefit noted but was not using it consistently more than 4 hours per night on average.  Overall sleep latency was 12 minutes, REM latency was 33 minutes, sleep efficiency 80.3%.  REM percentage was 18% for the study.    Today, 04/25/2020: We reviewed his inspire download, average usage is about 16 hours/week.  Incoming amplitude is 1.3.  He reports generally doing well with it but does have trouble going to sleep and maintaining sleep.  He has had better success sleeping in the recliner.  He is looking into getting an adjustable bed.  He has arthritis issues and it is difficult for him to maintain 1 sleep position.  He has had no pain, no swallowing difficulty, has healed well.  He would like to increase his start delay as well as his pause time.  Current settings are 30 minutes start delay and pause time is 15 minutes.  Duration of treatment is at 8 hours by default still.  Incoming amplitude is  1.3.  The patient's allergies, current medications, family history, past medical history, past social history, past surgical history and problem list were reviewed and updated as appropriate.    Previously:    I saw him on 12/22/2019 for his inspire activation, at which time his amplitude was set at 1 V with a final titration level of 1.8 V over 10 levels.  Start delay was 30 minutes, pause time 15 minutes.   I reviewed his inspire compliance download from 12/22/2019 through 03/06/2020.  He used his inspire 17 out of 75 days but days used more than 4 hours was only 36%, which is 27 out of 75 days, average usage of 3.2 hours.  He does report that he sometimes falls asleep without turning it on or if he turned it on and could not fall asleep he turns it back off and sometimes forgets to turn it back on.  He also reports that he is not sleeping well through the night and when he wakes up in the middle of the night and turns the unit off he will sometimes forget to turn it back on.  He is motivated to continue working on it.  Incoming amplitude is 1.4, he reports that he was able to get up to level 7.     I first met him on 12/21/19 for his initial evaluation at the request for Dr. Redmond Baseman.  The patient had a history of obstructive sleep apnea with CPAP and BiPAP intolerance.  He had inspire stimulator  placement on 05/14/5168 without complications.    12/21/19: (He) was diagnosed with obstructive sleep apnea several years ago.  He had sleep study testing in 2020 under Dr. Ermalene Postin.  He was originally on CPAP and then on BiPAP therapy.  He had discomfort with the BiPAP, particularly difficulty tolerating the mask, he had issues with mask fit after teeth extractions.  He has dentures for the top.  He reports that he used to be compliant with his BiPAP.  He reports that he was diagnosed with severe obstructive sleep apnea, last sleep study was in December 2020, report is not available for my review today but he reports  that he was confirmed to have severe sleep apnea.  He has not used his BiPAP in 8 or 9 months.  He had sleep endoscopy in May 2021 and had hypoglossal nerve stimulator placement on 11/11/2019.  His Epworth sleepiness score is 10/24,  Fatigue severity score is 37 out of 63.  He has chronic difficulty initiating and maintaining sleep, primarily going to sleep.  He currently takes amitriptyline for sleep.  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  He had recent carpal tunnel surgery and is supposed to have a right carpal tunnel surgery soon.  He lives in [place], [place], mom lives with him and his youngest daughter also lives with him.  He is divorced for many years, he also has an older daughter.  He smokes about a pack per day, currently not in the process of quitting.  He has a history of alcohol abuse by self-report and goes to AA, does not drink any alcohol any longer, smokes marijuana up to twice a week.  He drinks caffeine in the form of coffee, about 40 ounces per day.  He has recuperated well after his inspire procedure, had his left carpal tunnel surgery about a month ago.   His Past Medical History Is Significant For: Past Medical History:  Diagnosis Date  . Arthritis    RA  . Asthma   . Dry mouth   . GERD (gastroesophageal reflux disease)   . Heavy smoker    smokes 1pp/d  . Hypertension   . Hypothyroidism   . Rheumatoid aortitis   . Sleep apnea    does not wear CPAP  . Wears dentures    top plate  . Wears glasses     His Past Surgical History Is Significant For: Past Surgical History:  Procedure Laterality Date  . COLONOSCOPY    . DRUG INDUCED ENDOSCOPY N/A 08/15/2019   Procedure: DRUG INDUCED ENDOSCOPY;  Surgeon: Melida Quitter, MD;  Location: [facility];  Service: ENT;  Laterality: N/A;  . HERNIA REPAIR Bilateral   . IMPLANTATION OF HYPOGLOSSAL NERVE STIMULATOR Right 11/11/2019   Procedure: IMPLANTATION OF HYPOGLOSSAL NERVE STIMULATOR;   Surgeon: Melida Quitter, MD;  Location: [facility];  Service: ENT;  Laterality: Right;  . MULTIPLE TOOTH EXTRACTIONS    . ROTATOR CUFF REPAIR Right   . WISDOM TOOTH EXTRACTION      His Family History Is Significant For: No family history on file.  His Social History Is Significant For: Social History   Socioeconomic History  . Marital status: Single    Spouse name: Not on file  . Number of children: Not on file  . Years of education: Not on file  . Highest education level: Not on file  Occupational History  . Not on file  Tobacco Use  . Smoking status: Current Every  Day Smoker    Packs/day: 1.00    Types: Cigarettes  . Smokeless tobacco: Never Used  Vaping Use  . Vaping Use: Never used  Substance and Sexual Activity  . Alcohol use: Not Currently  . Drug use: Yes    Types: Marijuana    Comment:  " last use 2 weeks ago " 11/10/19  . Sexual activity: Not on file  Other Topics Concern  . Not on file  Social History Narrative  . Not on file   Social Determinants of Health   Financial Resource Strain: Not on file  Food Insecurity: Not on file  Transportation Needs: Not on file  Physical Activity: Not on file  Stress: Not on file  Social Connections: Not on file    His Allergies Are:  Allergies  Allergen Reactions  . Other     Bell peppers causes food poisoning  Cats shortness of breath, anaphylaxis    . Onion Diarrhea and Nausea And Vomiting    Causes food poisoning    :   His Current Medications Are:  Outpatient Encounter Medications as of 04/25/2020  Medication Sig  . acetaminophen (TYLENOL) 650 MG CR tablet Take 650-1,300 mg by mouth every 8 (eight) hours as needed for pain.   Marland Kitchen albuterol (VENTOLIN HFA) 108 (90 Base) MCG/ACT inhaler Inhale 1-2 puffs into the lungs every 6 (six) hours as needed for wheezing or shortness of breath.   Marland Kitchen amitriptyline (ELAVIL) 75 MG tablet Take 75 mg by mouth at bedtime.  . Ascorbic Acid (VITAMIN C) 1000 MG tablet Take 1,000 mg by  mouth daily.  Marland Kitchen atorvastatin (LIPITOR) 20 MG tablet Take 20 mg by mouth daily.  . baclofen (LIORESAL) 10 MG tablet Take 10 mg by mouth 2 (two) times daily.   . celecoxib (CELEBREX) 200 MG capsule Take 200 mg by mouth 2 (two) times daily.  . cholecalciferol (VITAMIN D3) 25 MCG (1000 UNIT) tablet Take 1,000 Units by mouth daily.  . fexofenadine (ALLEGRA) 180 MG tablet Take 180 mg by mouth daily.  . fluticasone (FLONASE) 50 MCG/ACT nasal spray Place 2 sprays into both nostrils daily.   . Fluticasone-Salmeterol (ADVAIR) 250-50 MCG/DOSE AEPB Inhale 1 puff into the lungs 2 (two) times daily.  Marland Kitchen gabapentin (NEURONTIN) 300 MG capsule Take 300 mg by mouth 3 (three) times daily.  . hydroxychloroquine (PLAQUENIL) 200 MG tablet Take 200 mg by mouth 2 (two) times daily.   Marland Kitchen levothyroxine (SYNTHROID) 88 MCG tablet Take 88 mcg by mouth daily before breakfast.  . lisinopril (ZESTRIL) 10 MG tablet Take 10 mg by mouth daily.  . montelukast (SINGULAIR) 10 MG tablet Take 10 mg by mouth at bedtime.  Marland Kitchen omeprazole (PRILOSEC) 40 MG capsule Take 40 mg by mouth daily.  . predniSONE (DELTASONE) 2.5 MG tablet Take 2.5 mg by mouth daily with breakfast.  . pseudoephedrine (SUDAFED) 120 MG 12 hr tablet Take 120 mg by mouth daily as needed for congestion.  Marland Kitchen rOPINIRole (REQUIP) 1 MG tablet Take 1 mg by mouth at bedtime.  . SUPER B COMPLEX/C PO Take 1 tablet by mouth daily.  . vitamin E 180 MG (400 UNITS) capsule Take 400 Units by mouth daily.  . [DISCONTINUED] HYDROcodone-acetaminophen (NORCO/VICODIN) 5-325 MG tablet Take 1-2 tablets by mouth every 6 (six) hours as needed for moderate pain. (Patient not taking: Reported on 04/25/2020)   No facility-administered encounter medications on file as of 04/25/2020.  :  Review of Systems:  Out of a complete 14 point review of  systems, all are reviewed and negative with the exception of these symptoms as listed below: Review of Systems  Neurological:       Here for inspire setting  evaluation and adjustment.     Objective:  Neurological Exam  Physical Exam Physical Examination:   Vitals:   04/25/20 1304  BP: 118/82  Pulse: 89  SpO2: 97%    General Examination: The patient is a very pleasant 65 y.o. male in no acute distress. He appears well-developed and well-nourished and well groomed.   HEENT:Extraocular tracking iswell preserved, hearing is grossly intact. Face is symmetric with normal facial animation. Speech is clear with no dysarthria noted. There is no hypophonia. There is no lip, neck/head, jaw or voice tremor. Neckshows FROM. Well-healed scaronright,below chin area.   Chest:CTA  Heart:Normal heart sounds without murmurs.  Abdomen:Non-distended.  Extremities:There isnoobviousedema.  Skin: Warm and dry without trophic changes noted.   Musculoskeletal: exam reveals no obvious joint deformities. Some motor restlessness noted in the legs and hands, stable.  Neurologically:  Mental status: The patient is awake, alert and oriented in all 4 spheres.Hisimmediate and remote memory, attention, language skills and fund of knowledge are appropriate. There is no evidence of aphasia, agnosia, apraxia or anomia. Speech is clear with normal prosody and enunciation. Thought process is linear. Mood is normaland affect is normal.  Cranial nerves II - XII are as described above under HEENT exam.  Motor exam:moving all 4 extremities well. Gait, station and balance:Hestands easily. No veering to one side is noted. No leaning to one side is noted. Posture is age-appropriate and stance is narrow based. Gait showsnormalstride length and normalpace. No problems turning are noted.   Assessmentand Plan:  In summary,Zachary L Blackwellis a very pleasant 57 year oldmalewith an underlying medical history of asthma, allergies, diabetes, hypertension, hyperlipidemia, rheumatoid arthritis, thyroid disease, carpal tunnel syndrome and plantar  fasciitis, and OSA with CPAP intolerance, whopresents for follow-up appointment. He had his Inspire activation in September 2021. He increased his treatment level to 1.4 V.  He had an interim titration study on 03/20/2020, treatment amplitude was deemed more closer to 1 V with an estimated AHI around 0/h.  We have programmed his level 3 amplitude at 1 V at this time with 2 levels above and 2 levels below, i.e. 0.8 V to 1.2 V.  He is reminded to try to maintain treatment and try to achieve at least 4 hours of sleep with treatment of his inspire.  He is currently sleeping more in his recliner and is looking into getting a adjustable bed. He is status post Inspire/hypoglossal nerve stimulator placement on 11/11/2019.  On 12/22/2019, his initial amplitude was set at 1.0 V with the final titration level of 1.8 V over 10 levels.  Today, we increased his start delay to 45 minutes and pause time to 30 minutes, therapy duration at the default setting of 8 hours.  He is advised to follow-up routinely in 6 months, call us with any interim questions or concerns and we will consider doing a home sleep test in about 6 months to review his residual sleep apnea on his current treatment settings.  I answered all his questions today and he was in agreement with the plan.  He is reminded to bring his remote for the appointments, he forgot his remote today.

## 2020-04-25 NOTE — Patient Instructions (Signed)
It was good to see you again today.   As discussed, we made a couple of changes today.  Start delay will be increased to 45 minutes and pause time will be increased to 30 minutes.  We have reduced your amplitude to 1 V at this time which is level 3.  You have the option to go up to more levels and the remote will give you the option if need be to go down to more levels in the future.  For now, please work on consistently turning the device on and trying to sleep with it more than 4 hours.   Your total duration of treatment time is 8 hours, we kept this the same. Please follow-up routinely in 6 months, call us with any interim questions or concerns.  We will consider doing a home sleep test in about 6 months to see how your apnea scores and snoring look and how you feel at the time.

## 2020-05-03 ENCOUNTER — Telehealth: Payer: Self-pay

## 2020-05-03 NOTE — Telephone Encounter (Signed)
Noted, thanks!

## 2020-05-03 NOTE — Telephone Encounter (Signed)
Called to see if patient is using his inspire more hours and every night.Marland Kitchen He is using more hours and not up to every night but still working on it. I encouraged him to keep going and improving. He did not want to drive here to download remote so I will check on him again in a month. His follow-up appt is June 2nd.

## 2020-05-03 NOTE — Telephone Encounter (Signed)
Noted, thanks for checking in on him.

## 2020-06-18 ENCOUNTER — Telehealth: Payer: Self-pay

## 2020-06-18 NOTE — Telephone Encounter (Signed)
LVM for patient to return call to see if he is doing better with his Inspire device.

## 2020-09-24 ENCOUNTER — Ambulatory Visit: Payer: Medicare HMO | Admitting: Neurology

## 2020-09-24 ENCOUNTER — Encounter: Payer: Self-pay | Admitting: Neurology

## 2020-09-24 VITALS — BP 122/80 | HR 78 | Ht 65.0 in | Wt 175.3 lb

## 2020-09-24 DIAGNOSIS — G4733 Obstructive sleep apnea (adult) (pediatric): Secondary | ICD-10-CM | POA: Diagnosis not present

## 2020-09-24 DIAGNOSIS — Z9682 Presence of neurostimulator: Secondary | ICD-10-CM | POA: Diagnosis not present

## 2020-09-24 DIAGNOSIS — Z789 Other specified health status: Secondary | ICD-10-CM | POA: Diagnosis not present

## 2020-09-24 NOTE — Progress Notes (Signed)
Subjective:    Patient ID: Zachary Henderson is a 65 y.o. male.  HPI    Interim history:   [name] is a 65 year old right-handed gentleman with an underlying medical history of asthma, allergies, diabetes, hypertension, hyperlipidemia, rheumatoid arthritis, thyroid disease, carpal tunnel syndrome and plantar fasciitis, who presents for follow-up consultation of his obstructive sleep apnea, treated with Inspire. The patient is unaccompanied today. The [employer], Zachary Henderson, assists with the appointment. I last saw him on 04/25/2020, at which time he was not very consistent with his inspire.  He was sleeping in the recliner.  He was looking into getting an adjustable bed.  He had an incoming amplitude of 1.3 V.  We talked about his inspire titration study results.  We changed his settings to a start delay of 45 minutes and pause time of 30 minutes.  We set his amplitude at 1.0 V, level 3.  He was encouraged to try to be more consistent with inspire usage.  Today, 09/24/2020: I reviewed his inspire usage.  He has not been consistent with his inspire.  Average usage is about 2.6 hours per night, 15 hours/week.  He has an average +0.6 hours per night.  He has been using his inspire almost every night but not very long.  His current start delay is 45 minutes.  His causes 30 minutes.  His current inspire voltage is level 5, 1.2 V.  He is not aware of changing this since his last appointment but inadvertently may have increased his voltage.  He has trouble falling asleep and staying asleep, this is a chronic problem.  He is currently on amitriptyline.  He has tried Benadryl in the past and also melatonin.  He has not tried melatonin with the amitriptyline.  Amitriptyline is currently 75 mg at bedtime.  He is no longer on baclofen and no longer on gabapentin which he took for his carpal tunnel.  He is hoping to get an adjustable base for his bed.  Sometimes he sleeps in the recliner which is not in  his bedroom.   The patient's allergies, current medications, family history, past medical history, past social history, past surgical history and problem list were reviewed and updated as appropriate.    Previously:   I saw him on 03/06/2020, at which time he Reported some neck discomfort but no pain.  He noticed recent allergy flareup but was using medication for this.  He had an interim titration study on 03/20/2020, at which time his therapeutic amplitude was found to be around 1 V, AHI was estimated to be around 0/h.  His incoming voltage was 1.4.  He did have some difficulty maintaining sleep and also initiating sleep.  He has been using his inspire about 20 hours/week with subjective benefit noted but was not using it consistently more than 4 hours per night on average.  Overall sleep latency was 12 minutes, REM latency was 33 minutes, sleep efficiency 80.3%.  REM percentage was 18% for the study.    I saw him on 12/22/2019 for his inspire activation, at which time his amplitude was set at 1 V with a final titration level of 1.8 V over 10 levels.  Start delay was 30 minutes, pause time 15 minutes.   I reviewed his inspire compliance download from 12/22/2019 through 03/06/2020.  He used his inspire 15 out of 75 days but days used more than 4 hours was only 36%, which is 27 out of 75 days, average usage of 3.2  hours.  He does report that he sometimes falls asleep without turning it on or if he turned it on and could not fall asleep he turns it back off and sometimes forgets to turn it back on.  He also reports that he is not sleeping well through the night and when he wakes up in the middle of the night and turns the unit off he will sometimes forget to turn it back on.  He is motivated to continue working on it.  Incoming amplitude is 1.4, he reports that he was able to get up to level 7.     I first met him on 12/21/19 for his initial evaluation at the request for Dr. Redmond Baseman.  The patient had a  history of obstructive sleep apnea with CPAP and BiPAP intolerance.  He had inspire stimulator placement on 0/08/3974 without complications.   12/21/19: (He) was diagnosed with obstructive sleep apnea several years ago.  He had sleep study testing in 2020 under Dr. Ermalene Postin.  He was originally on CPAP and then on BiPAP therapy.  He had discomfort with the BiPAP, particularly difficulty tolerating the mask, he had issues with mask fit after teeth extractions.  He has dentures for the top.  He reports that he used to be compliant with his BiPAP.  He reports that he was diagnosed with severe obstructive sleep apnea, last sleep study was in December 2020, report is not available for my review today but he reports that he was confirmed to have severe sleep apnea.  He has not used his BiPAP in 8 or 9 months.  He had sleep endoscopy in May 2021 and had hypoglossal nerve stimulator placement on 11/11/2019.  His Epworth sleepiness score is 10/24, Fatigue severity score is 37 out of 63.  He has chronic difficulty initiating and maintaining sleep, primarily going to sleep.  He currently takes amitriptyline for sleep.  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  He had recent carpal tunnel surgery and is supposed to have a right carpal tunnel surgery soon.  He lives in [place], [place], mom lives with him and his youngest daughter also lives with him.  He is divorced for many years, he also has an older daughter.  He smokes about a pack per day, currently not in the process of quitting.  He has a history of alcohol abuse by self-report and goes to AA, does not drink any alcohol any longer, smokes marijuana up to twice a week.  He drinks caffeine in the form of coffee, about 40 ounces per day. He has recuperated well after his inspire procedure, had his left carpal tunnel surgery about a month ago.  His Past Medical History Is Significant For: Past Medical History:  Diagnosis Date   Arthritis    RA    Asthma    Dry mouth    GERD (gastroesophageal reflux disease)    Heavy smoker    smokes 1pp/d   Hypertension    Hypothyroidism    Rheumatoid aortitis    Sleep apnea    does not wear CPAP   Wears dentures    top plate   Wears glasses     His Past Surgical History Is Significant For: Past Surgical History:  Procedure Laterality Date   COLONOSCOPY     DRUG INDUCED ENDOSCOPY N/A 08/15/2019   Procedure: DRUG INDUCED ENDOSCOPY;  Surgeon: Melida Quitter, MD;  Location: [facility];  Service: ENT;  Laterality: N/A;  HERNIA REPAIR Bilateral    IMPLANTATION OF HYPOGLOSSAL NERVE STIMULATOR Right 11/11/2019   Procedure: IMPLANTATION OF HYPOGLOSSAL NERVE STIMULATOR;  Surgeon: Melida Quitter, MD;  Location: [facility];  Service: ENT;  Laterality: Right;   MULTIPLE TOOTH EXTRACTIONS     ROTATOR CUFF REPAIR Right    WISDOM TOOTH EXTRACTION      His Family History Is Significant For: No family history on file.  His Social History Is Significant For: Social History   Socioeconomic History   Marital status: Single    Spouse name: Not on file   Number of children: Not on file   Years of education: Not on file   Highest education level: Not on file  Occupational History   Not on file  Tobacco Use   Smoking status: Every Day    Packs/day: 1.00    Pack years: 0.00    Types: Cigarettes   Smokeless tobacco: Never  Vaping Use   Vaping Use: Never used  Substance and Sexual Activity   Alcohol use: Not Currently   Drug use: Yes    Types: Marijuana    Comment:  " last use 2 weeks ago " 11/10/19   Sexual activity: Not on file  Other Topics Concern   Not on file  Social History Narrative   Not on file   Social Determinants of Health   Financial Resource Strain: Not on file  Food Insecurity: Not on file  Transportation Needs: Not on file  Physical Activity: Not on file  Stress: Not on file  Social Connections: Not on file    His Allergies Are:  Allergies  Allergen  Reactions   Other     Bell peppers causes food poisoning  Cats shortness of breath, anaphylaxis     Onion Diarrhea and Nausea And Vomiting    Causes food poisoning    :   His Current Medications Are:  Outpatient Encounter Medications as of 09/24/2020  Medication Sig   acetaminophen (TYLENOL) 650 MG CR tablet Take 650-1,300 mg by mouth every 8 (eight) hours as needed for pain.    albuterol (VENTOLIN HFA) 108 (90 Base) MCG/ACT inhaler Inhale 1-2 puffs into the lungs every 6 (six) hours as needed for wheezing or shortness of breath.    amitriptyline (ELAVIL) 75 MG tablet Take 75 mg by mouth at bedtime.   Ascorbic Acid (VITAMIN C) 1000 MG tablet Take 1,000 mg by mouth daily.   atorvastatin (LIPITOR) 20 MG tablet Take 20 mg by mouth daily.   celecoxib (CELEBREX) 200 MG capsule Take 200 mg by mouth 2 (two) times daily.   cholecalciferol (VITAMIN D3) 25 MCG (1000 UNIT) tablet Take 1,000 Units by mouth daily.   fexofenadine (ALLEGRA) 180 MG tablet Take 180 mg by mouth daily.   fluticasone (FLONASE) 50 MCG/ACT nasal spray Place 2 sprays into both nostrils daily.    Fluticasone-Salmeterol (ADVAIR) 250-50 MCG/DOSE AEPB Inhale 1 puff into the lungs 2 (two) times daily.   hydroxychloroquine (PLAQUENIL) 200 MG tablet Take 200 mg by mouth 2 (two) times daily.    levothyroxine (SYNTHROID) 88 MCG tablet Take 88 mcg by mouth daily before breakfast.   lisinopril (ZESTRIL) 10 MG tablet Take 10 mg by mouth daily.   montelukast (SINGULAIR) 10 MG tablet Take 10 mg by mouth at bedtime.   omeprazole (PRILOSEC) 40 MG capsule Take 40 mg by mouth daily.   predniSONE (DELTASONE) 2.5 MG tablet Take 2.5 mg by mouth daily with breakfast.   pseudoephedrine (SUDAFED) 120 MG  12 hr tablet Take 120 mg by mouth daily as needed for congestion.   rOPINIRole (REQUIP) 1 MG tablet Take 1 mg by mouth at bedtime.   SUPER B COMPLEX/C PO Take 1 tablet by mouth daily.   vitamin E 180 MG (400 UNITS) capsule Take 400 Units by mouth  daily.   [DISCONTINUED] baclofen (LIORESAL) 10 MG tablet Take 10 mg by mouth 2 (two) times daily.    [DISCONTINUED] gabapentin (NEURONTIN) 300 MG capsule Take 300 mg by mouth 3 (three) times daily.   No facility-administered encounter medications on file as of 09/24/2020.  :  Review of Systems:  Out of a complete 14 point review of systems, all are reviewed and negative with the exception of these symptoms as listed below:  Review of Systems  Neurological:        Here for 6 month f/u on inspire. Reports over all he is doing ok, still has trouble falling asleep.    Objective:  Neurological Exam  Physical Exam Physical Examination:   Vitals:   09/24/20 1034  BP: 122/80  Pulse: 78  SpO2: 99%    General Examination: The patient is a very pleasant 65 y.o. male in no acute distress. He appears well-developed and well-nourished and well groomed.   HEENT: Extraocular tracking is well preserved, hearing is grossly intact. Face is symmetric with normal facial animation. Speech is clear with no dysarthria noted. There is no hypophonia. There is no lip, neck/head, jaw or voice tremor. Neck shows FROM. Well-healed scar on right, below chin area.   Chest: CTA    Heart: Normal heart sounds without murmurs.    Abdomen: Non-distended.   Extremities: There is no obvious edema.   Skin: Warm and dry without trophic changes noted.   Musculoskeletal: exam reveals no obvious joint deformities. Some motor restlessness noted in the legs and hands, stable.   Neurologically: Mental status: The patient is awake, alert and oriented in all 4 spheres. His immediate and remote memory, attention, language skills and fund of knowledge are appropriate. There is no evidence of aphasia, agnosia, apraxia or anomia. Speech is clear with normal prosody and enunciation. Thought process is linear. Mood is normal and affect is normal. Cranial nerves II - XII are as described above under HEENT exam. Motor exam:  moving all 4 extremities well.  Gait, station and balance: He stands easily. No veering to one side is noted. No leaning to one side is noted. Posture is age-appropriate and stance is narrow based. Gait shows normal stride length and normal pace. No problems turning are noted.   Assessment and Plan:  In summary, Zachary Henderson is a very pleasant 65 year old male with an underlying medical history of asthma, allergies, diabetes, hypertension, hyperlipidemia, rheumatoid arthritis, thyroid disease, carpal tunnel syndrome and plantar fasciitis, and OSA with history of CPAP intolerance, who presents for follow-up appointment of his OSA with s/p Inspire on 11/11/2019. He had his Inspire activation in September 2021; on 12/22/2019, his initial amplitude was set at 1.0 V with the final titration level of 1.8 V over 10 levels.  He had an interim titration study on 03/20/2020, treatment amplitude was deemed more closer to 1 V with an estimated AHI around 0/h.  In January 2022, we programmed his level 3 amplitude at 1 V with 2 levels above and 2 levels below (i.e. range of 0.8 V to 1.2 V). He has increased his level to 1.2 V (level 5), and has had trouble initiating and  maintaining sleep which has been an ongoing problem.  He is advised to add some melatonin at night, 5 to 10 mg in addition to his nighttime medications currently.  He was programmed back to level 3 which is 1.0 V.  He is advised to stay at this level for now and work on consistency of using his inspire device nightly.  He was re-familiarized on the different settings of his remote, such as the start delay, pause feature vs. Off button.  We increased his start delay to 45 minutes and pause time to 30 minutes at his appointment in January 2022.  He is advised to follow-up routinely in this clinic in 3 to 4 months.  We will consider doing a home sleep test while he is on treatment at the time.  He is advised to call us or email [place] through [facility] with any  interim questions or concerns.  I answered all his questions today and he was in agreement. I spent 30 minutes in total face-to-face time and in reviewing records during pre-charting, more than 50% of which was spent in counseling and coordination of care, reviewing test results, reviewing medications and treatment regimen and/or in discussing or reviewing the diagnosis of OSA, the prognosis and treatment options. Pertinent laboratory and imaging test results that were available during this visit with the patient were reviewed by me and considered in my medical decision making (see chart for details).

## 2020-09-24 NOTE — Patient Instructions (Signed)
It was good to see you again today. As discussed, we have changed your stimulation level on your Inspire back to level 3 which is 1 V.  Please keep this level for now.  Your start delay is 45 minutes and pause is 30 minutes.  Please try to work on consistently using your device every night for at least 4 hours.  We will plan a follow-up in about 4 months.  We will consider doing a home sleep test at the next visit to evaluate your sleep apnea while you are on treatment.

## 2021-01-28 ENCOUNTER — Ambulatory Visit: Payer: Medicare HMO | Admitting: Neurology

## 2021-02-18 ENCOUNTER — Ambulatory Visit (INDEPENDENT_AMBULATORY_CARE_PROVIDER_SITE_OTHER): Payer: Self-pay | Admitting: Neurology

## 2021-02-19 ENCOUNTER — Encounter: Payer: Self-pay | Admitting: Neurology

## 2021-02-25 ENCOUNTER — Ambulatory Visit: Payer: Medicare HMO | Admitting: Neurology

## 2021-05-06 ENCOUNTER — Ambulatory Visit: Payer: Medicare HMO | Admitting: Neurology

## 2021-05-30 ENCOUNTER — Ambulatory Visit: Payer: Medicare HMO | Admitting: Neurology

## 2021-07-31 ENCOUNTER — Ambulatory Visit: Payer: Medicare HMO | Admitting: Neurology

## 2021-09-30 ENCOUNTER — Encounter: Payer: Self-pay | Admitting: Neurology

## 2021-10-01 ENCOUNTER — Encounter: Payer: Self-pay | Admitting: Neurology

## 2021-10-01 ENCOUNTER — Ambulatory Visit: Payer: Medicare HMO | Admitting: Neurology

## 2021-10-01 VITALS — BP 118/78 | HR 76 | Ht 65.0 in | Wt 159.0 lb

## 2021-10-01 DIAGNOSIS — G4733 Obstructive sleep apnea (adult) (pediatric): Secondary | ICD-10-CM

## 2021-10-01 DIAGNOSIS — Z9682 Presence of neurostimulator: Secondary | ICD-10-CM | POA: Diagnosis not present

## 2021-10-01 NOTE — Progress Notes (Signed)
Subjective:    Patient ID: Zachary Henderson is a 66 y.o. male.  HPI    Interim history:   Zachary Henderson is a 66 year old right-handed gentleman with an underlying medical history of asthma, allergies, diabetes, hypertension, hyperlipidemia, rheumatoid arthritis, thyroid disease, carpal tunnel syndrome and plantar fasciitis, who presents for follow-up consultation of his obstructive sleep apnea, treated with Inspire. The patient is unaccompanied today. The [employer], Zachary Henderson, assists with the appointment and the sleep lab manager, Zachary Henderson joins for this appointment as well. I last saw him on 09/24/2020, at which time he was not using his inspire consistently.  We reduced his stimulation to 1 V.  He was having difficulty initiating sleep.  He was on amitriptyline 75 mg at bedtime.  He saw Dr. Maple Hudson in the interim in October 2022, at which time he was advised to continue with ropinirole 1 mg at bedtime for his RLS.   Today, 10/01/2021: I reviewed his inspire usage.  He has not been consistent with his inspire.  In the past 6 months he has used his inspire 129 nights out of 181, about 70% of the time, percent use days greater than 4 hours was only 13% with an average usage for days on treatment of 2.3 hours only.  He reports ongoing difficulty with initiating sleep.  He takes melatonin 10 mg about 2 hours before bedtime and amitriptyline at bedtime.  He has not used his device some nights, about 30% of the time.  His inspire is currently at 1 V.  We assessed his tongue movement at 0.6 V which showed visible tongue movement and was tolerable.  He has used his pause feature, it takes him longer than 45 minutes to fall asleep so we increased his start delay to 1 hour today and reset his inspire stimulation to level 1 equaling 0.6 V with a goal of titrating weekly if possible up to level 5 which should be 1.0 V.  Pause time will remain at 30 minutes.  He has an adjustable bed and sleeps with  the head of bed raised a little bit.  The patient's allergies, current medications, family history, past medical history, past social history, past surgical history and problem list were reviewed and updated as appropriate.    Previously:   I saw him on 04/25/2020, at which time he was not very consistent with his inspire.  He was sleeping in the recliner.  He was looking into getting an adjustable bed.  He had an incoming amplitude of 1.3 V.  We talked about his inspire titration study results.  We changed his settings to a start delay of 45 minutes and pause time of 30 minutes.  We set his amplitude at 1.0 V, level 3.  He was encouraged to try to be more consistent with inspire usage.     I saw him on 03/06/2020, at which time he Reported some neck discomfort but no pain.  He noticed recent allergy flareup but was using medication for this.  He had an interim titration study on 03/20/2020, at which time his therapeutic amplitude was found to be around 1 V, AHI was estimated to be around 0/h.  His incoming voltage was 1.4.  He did have some difficulty maintaining sleep and also initiating sleep.  He has been using his inspire about 20 hours/week with subjective benefit noted but was not using it consistently more than 4 hours per night on average.  Overall sleep latency was 12 minutes, REM  latency was 33 minutes, sleep efficiency 80.3%.  REM percentage was 18% for the study.    I saw him on 12/22/2019 for his inspire activation, at which time his amplitude was set at 1 V with a final titration level of 1.8 V over 10 levels.  Start delay was 30 minutes, pause time 15 minutes.   I reviewed his inspire compliance download from 12/22/2019 through 03/06/2020.  He used his inspire 75 out of 75 days but days used more than 4 hours was only 36%, which is 27 out of 75 days, average usage of 3.2 hours.  He does report that he sometimes falls asleep without turning it on or if he turned it on and could not fall  asleep he turns it back off and sometimes forgets to turn it back on.  He also reports that he is not sleeping well through the night and when he wakes up in the middle of the night and turns the unit off he will sometimes forget to turn it back on.  He is motivated to continue working on it.  Incoming amplitude is 1.4, he reports that he was able to get up to level 7.     I first met him on 12/21/19 for his initial evaluation at the request for Dr. Jenne Pane.  The patient had a history of obstructive sleep apnea with CPAP and BiPAP intolerance.  He had inspire stimulator placement on 11/11/2019 without complications.   12/21/19: (He) was diagnosed with obstructive sleep apnea several years ago.  He had sleep study testing in 2020 under Dr. Maple Hudson.  He was originally on CPAP and then on BiPAP therapy.  He had discomfort with the BiPAP, particularly difficulty tolerating the mask, he had issues with mask fit after teeth extractions.  He has dentures for the top.  He reports that he used to be compliant with his BiPAP.  He reports that he was diagnosed with severe obstructive sleep apnea, last sleep study was in December 2020, report is not available for my review today but he reports that he was confirmed to have severe sleep apnea.  He has not used his BiPAP in 8 or 9 months.  He had sleep endoscopy in May 2021 and had hypoglossal nerve stimulator placement on 11/11/2019.  His Epworth sleepiness score is 10/24, Fatigue severity score is 37 out of 63.  He has chronic difficulty initiating and maintaining sleep, primarily going to sleep.  He currently takes amitriptyline for sleep.  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  He had recent carpal tunnel surgery and is supposed to have a right carpal tunnel surgery soon.  He lives in [place], [place], mom lives with him and his youngest daughter also lives with him.  He is divorced for many years, he also has an older daughter.  He smokes about a pack  per day, currently not in the process of quitting.  He has a history of alcohol abuse by self-report and goes to AA, does not drink any alcohol any longer, smokes marijuana up to twice a week.  He drinks caffeine in the form of coffee, about 40 ounces per day. He has recuperated well after his inspire procedure, had his left carpal tunnel surgery about a month ago.    His Past Medical History Is Significant For: Past Medical History:  Diagnosis Date   Arthritis    RA   Asthma    Dry mouth    GERD (gastroesophageal reflux  disease)    Heavy smoker    smokes 1pp/d   Hypertension    Hypothyroidism    Rheumatoid aortitis    Sleep apnea    does not wear CPAP   Wears dentures    top plate   Wears glasses     His Past Surgical History Is Significant For: Past Surgical History:  Procedure Laterality Date   COLONOSCOPY     DRUG INDUCED ENDOSCOPY N/A 08/15/2019   Procedure: DRUG INDUCED ENDOSCOPY;  Surgeon: Zachary Reading, MD;  Location: [facility];  Service: ENT;  Laterality: N/A;   HERNIA REPAIR Bilateral    IMPLANTATION OF HYPOGLOSSAL NERVE STIMULATOR Right 11/11/2019   Procedure: IMPLANTATION OF HYPOGLOSSAL NERVE STIMULATOR;  Surgeon: Zachary Reading, MD;  Location: [facility] OR;  Service: ENT;  Laterality: Right;   MULTIPLE TOOTH EXTRACTIONS     ROTATOR CUFF REPAIR Right    WISDOM TOOTH EXTRACTION      His Family History Is Significant For: Family History  Problem Relation Age of Onset   Sleep apnea Neg Hx     His Social History Is Significant For: Social History   Socioeconomic History   Marital status: Single    Spouse name: Not on file   Number of children: Not on file   Years of education: Not on file   Highest education level: Not on file  Occupational History   Not on file  Tobacco Use   Smoking status: Every Day    Packs/day: 1.00    Types: Cigarettes   Smokeless tobacco: Never  Vaping Use   Vaping Use: Never used  Substance and Sexual Activity    Alcohol use: Not Currently   Drug use: Yes    Types: Marijuana    Comment:  " last use 2 weeks ago " 11/10/19   Sexual activity: Not on file  Other Topics Concern   Not on file  Social History Narrative   Not on file   Social Determinants of Health   Financial Resource Strain: Not on file  Food Insecurity: Not on file  Transportation Needs: Not on file  Physical Activity: Not on file  Stress: Not on file  Social Connections: Not on file    His Allergies Are:  Allergies  Allergen Reactions   Other     Bell peppers causes food poisoning  Cats shortness of breath, anaphylaxis     Onion Diarrhea and Nausea And Vomiting    Causes food poisoning    :   His Current Medications Are:  Outpatient Encounter Medications as of 10/01/2021  Medication Sig   acetaminophen (TYLENOL) 650 MG CR tablet Take 650-1,300 mg by mouth every 8 (eight) hours as needed for pain.    albuterol (VENTOLIN HFA) 108 (90 Base) MCG/ACT inhaler Inhale 1-2 puffs into the lungs every 6 (six) hours as needed for wheezing or shortness of breath.    amitriptyline (ELAVIL) 75 MG tablet Take 75 mg by mouth at bedtime.   atorvastatin (LIPITOR) 20 MG tablet Take 20 mg by mouth daily.   cholecalciferol (VITAMIN D3) 25 MCG (1000 UNIT) tablet Take 1,000 Units by mouth daily.   fexofenadine (ALLEGRA) 180 MG tablet Take 180 mg by mouth daily.   Fluticasone-Salmeterol (ADVAIR) 250-50 MCG/DOSE AEPB Inhale 1 puff into the lungs 2 (two) times daily.   hydroxychloroquine (PLAQUENIL) 200 MG tablet Take 200 mg by mouth 2 (two) times daily.    levothyroxine (SYNTHROID) 88 MCG tablet Take 88 mcg by mouth daily before  breakfast.   lisinopril (ZESTRIL) 10 MG tablet Take 10 mg by mouth daily.   mometasone (NASONEX) 50 MCG/ACT nasal spray 2 sprays daily.   montelukast (SINGULAIR) 10 MG tablet Take 10 mg by mouth at bedtime.   omeprazole (PRILOSEC) 40 MG capsule Take 40 mg by mouth daily.   pseudoephedrine (SUDAFED) 120 MG 12 hr tablet  Take 120 mg by mouth daily as needed for congestion.   rOPINIRole (REQUIP) 1 MG tablet Take 1 mg by mouth at bedtime.   SUPER B COMPLEX/C PO Take 1 tablet by mouth daily.   vitamin E 180 MG (400 UNITS) capsule Take 400 Units by mouth daily.   Ascorbic Acid (VITAMIN C) 1000 MG tablet Take 1,000 mg by mouth daily.   celecoxib (CELEBREX) 200 MG capsule Take 200 mg by mouth 2 (two) times daily.   fluticasone (FLONASE) 50 MCG/ACT nasal spray Place 2 sprays into both nostrils daily.    predniSONE (DELTASONE) 2.5 MG tablet Take 2.5 mg by mouth daily with breakfast.   No facility-administered encounter medications on file as of 10/01/2021.  :  Review of Systems:  Out of a complete 14 point review of systems, all are reviewed and negative with the exception of these symptoms as listed below:  Review of Systems  Neurological:        Pt here for  inspire follow up     Objective:  Neurological Exam  Physical Exam Physical Examination:   Vitals:   10/01/21 1051  BP: 118/78  Pulse: 76    General Examination: The patient is a very pleasant 66 y.o. male in no acute distress. He appears well-developed and well-nourished and well groomed.   HEENT: Extraocular tracking is well preserved, hearing is grossly intact. Face is symmetric with normal facial animation. Speech is clear with no dysarthria noted. There is no hypophonia. There is no lip, neck/head, jaw or voice tremor. Neck shows FROM. Well-healed scar on right, below chin area.  No deviation of his tongue movements with inspire on.    Chest: CTA    Heart: Normal heart sounds without murmurs.    Abdomen: Non-distended.   Extremities: There is no obvious edema.   Skin: Warm and dry without trophic changes noted.   Musculoskeletal: exam reveals no obvious joint deformities. Some motor restlessness noted in the legs and hands, stable.   Neurologically: Mental status: The patient is awake, alert and oriented in all 4 spheres. His  immediate and remote memory, attention, language skills and fund of knowledge are appropriate. There is no evidence of aphasia, agnosia, apraxia or anomia. Speech is clear with normal prosody and enunciation. Thought process is linear. Mood is normal and affect is normal. Cranial nerves II - XII are as described above under HEENT exam. Motor exam: moving all 4 extremities well.  Gait, station and balance: He stands easily. No veering to one side is noted. No leaning to one side is noted. Posture is age-appropriate and stance is narrow based. Gait shows normal stride length and normal pace. No problems turning are noted.   Assessment and Plan:  In summary, RYELIN CHAPARRO is a 66 year old male with an underlying medical history of asthma, allergies, diabetes, hypertension, hyperlipidemia, rheumatoid arthritis, thyroid disease, carpal tunnel syndrome and plantar fasciitis, and OSA with history of CPAP intolerance, who presents for follow-up appointment of his OSA with s/p Inspire on 11/11/2019. He had his Inspire activation in September 2021; on 12/22/2019, his initial amplitude was set at 1.0 V  with the final titration level of 1.8 V over 10 levels.  He had an interim titration study on 03/20/2020, optimal treatment amplitude was deemed more closer to 1 V with an estimated AHI around 0/h.  In January 2022, we programmed his level 3 amplitude at 1 V with 2 levels above and 2 levels below (i.e. range of 0.8 V to 1.2 V). He has trouble initiating and maintaining sleep which has been an ongoing problem.  Today, he was programmed back to level 1 which at 0.6 V.  He is advised to gradually, at the most once a week to level 5 which should be 1.0 V in the next 5 to 6 weeks or so.  He is advised to follow-up in 3 months and we will consider doing a home sleep test at that time if he is able to consistently use his inspire device.   We increased his start delay to 60 min from 45 minutes and pause time stays at 30  minutes. He is advised to call us or email [place] through MyChart with any interim questions or concerns.  I answered all his questions today and he was in agreement. I spent 30 minutes in total face-to-face time and in reviewing records during pre-charting, more than 50% of which was spent in counseling and coordination of care, reviewing test results, reviewing medications and treatment regimen and/or in discussing or reviewing the diagnosis of OSA, the prognosis and treatment options. Pertinent laboratory and imaging test results that were available during this visit with the patient were reviewed by me and considered in my medical decision making (see chart for details).

## 2021-12-31 ENCOUNTER — Ambulatory Visit: Payer: Medicare HMO | Admitting: Neurology

## 2021-12-31 ENCOUNTER — Encounter: Payer: Self-pay | Admitting: Neurology

## 2021-12-31 VITALS — BP 136/84 | HR 76 | Ht 65.0 in | Wt 166.0 lb

## 2021-12-31 DIAGNOSIS — G4733 Obstructive sleep apnea (adult) (pediatric): Secondary | ICD-10-CM

## 2021-12-31 DIAGNOSIS — Z9682 Presence of neurostimulator: Secondary | ICD-10-CM | POA: Diagnosis not present

## 2021-12-31 DIAGNOSIS — G4700 Insomnia, unspecified: Secondary | ICD-10-CM | POA: Diagnosis not present

## 2021-12-31 DIAGNOSIS — Z789 Other specified health status: Secondary | ICD-10-CM

## 2021-12-31 NOTE — Progress Notes (Signed)
Subjective:    Patient ID: Zachary Henderson is a 66 y.o. male.  HPI    Interim history:   Zachary Henderson is a 66 year old right-handed gentleman with an underlying medical history of asthma, allergies, diabetes, hypertension, hyperlipidemia, rheumatoid arthritis, thyroid disease, carpal tunnel syndrome and plantar fasciitis, who presents for follow-up consultation of his obstructive sleep apnea, treated with Inspire. The patient is unaccompanied today. The [employer], [place], and our sleep lab manager, Barrie Lyme join [place] for this appointment as well with interrogation of the device and adjustment of the settings. I last saw him on 10/01/2021, at which time he was not fully compliant with his inspire, used his device about 70% in the preceding 6 months.  His average usage for days on treatment was less than 2-1/2 hours with percent use days greater than 4 hours at only 13% from 6 months.  He reported having difficulty going to sleep.  We changed his inspire stimulation setting to 0.6 V.  He was advised to titrate up gradually and weekly increments to up to 1 V, equaling level 5.  We consider doing a home sleep test at the next visit.  Today, 12/31/2021: I reviewed his inspire compliance data for the past 3 months.  He has used his inspire device 80% of the time between 10/01/2021 and 12/30/2021 which is a total of 90 days.  Percent use days greater than 4 hours is at 40%, average usage for days on treatment was 4.2 hours.  He has increased his settings twice to level 3 which is currently 0.8 V.  He does have trouble going to sleep, he tends to watch TV in bed, he has an adjustable bed.  He keeps his head of bed elevated.  He goes to bed around 10 PM and generally watches TV for at least 2 hours.  His rise time is somewhere between 8 and 9 AM but he does not sleep through the night.  He generally takes melatonin at 9 PM but takes his ropinirole and amitriptyline later.  He does not feel that the  amitriptyline is helping him sleep.  Ropinirole is for his restless legs. He reports right knee pain.  He is followed by orthopedics.  He may need gel injections next.  He is wearing a brace.  He may take an afternoon nap, no more than 1 hour.  Does not typically turn on his device for his nap.  When he uses the inspire, he has had several nights where he used the pause feature many times.  Start tonight is currently at 60 minutes, pause time currently maxed out at 30 minutes.  We tried the inspire stimulation at the current setting of 0.8 V and increased to 0.9 V as well as 1.0 V.  He did have central tongue protrusion, mild discomfort on the higher settings.   The patient's allergies, current medications, family history, past medical history, past social history, past surgical history and problem list were reviewed and updated as appropriate.    Previously:   I saw him on 09/24/2020, at which time he was not using his inspire consistently.  We reduced his stimulation to 1 V.  He was having difficulty initiating sleep.  He was on amitriptyline 75 mg at bedtime.   He saw Dr. Ermalene Postin in the interim in October 2022, at which time he was advised to continue with ropinirole 1 mg at bedtime for his RLS.       I saw him on 04/25/2020,  at which time he was not very consistent with his inspire.  He was sleeping in the recliner.  He was looking into getting an adjustable bed.  He had an incoming amplitude of 1.3 V.  We talked about his inspire titration study results.  We changed his settings to a start delay of 45 minutes and pause time of 30 minutes.  We set his amplitude at 1.0 V, level 3.  He was encouraged to try to be more consistent with inspire usage.     I saw him on 03/06/2020, at which time he reported some neck discomfort but no pain.  He noticed recent allergy flareup but was using medication for this.  He had an interim titration study on 03/20/2020, at which time his therapeutic amplitude was found  to be around 1 V, AHI was estimated to be around 0/h.  His incoming voltage was 1.4.  He did have some difficulty maintaining sleep and also initiating sleep.  He has been using his inspire about 20 hours/week with subjective benefit noted but was not using it consistently more than 4 hours per night on average.  Overall sleep latency was 12 minutes, REM latency was 33 minutes, sleep efficiency 80.3%.  REM percentage was 18% for the study.    I saw him on 12/22/2019 for his inspire activation, at which time his amplitude was set at 1 V with a final titration level of 1.8 V over 10 levels.  Start delay was 30 minutes, pause time 15 minutes.   I reviewed his inspire compliance download from 12/22/2019 through 03/06/2020.  He used his inspire 44 out of 75 days but days used more than 4 hours was only 36%, which is 27 out of 75 days, average usage of 3.2 hours.  He does report that he sometimes falls asleep without turning it on or if he turned it on and could not fall asleep he turns it back off and sometimes forgets to turn it back on.  He also reports that he is not sleeping well through the night and when he wakes up in the middle of the night and turns the unit off he will sometimes forget to turn it back on.  He is motivated to continue working on it.  Incoming amplitude is 1.4, he reports that he was able to get up to level 7.     I first met him on 12/21/19 for his initial evaluation at the request for Dr. Redmond Baseman.  The patient had a history of obstructive sleep apnea with CPAP and BiPAP intolerance.  He had inspire stimulator placement on 06/09/2874 without complications.   12/21/19: (He) was diagnosed with obstructive sleep apnea several years ago.  He had sleep study testing in 2020 under Dr. Ermalene Postin.  He was originally on CPAP and then on BiPAP therapy.  He had discomfort with the BiPAP, particularly difficulty tolerating the mask, he had issues with mask fit after teeth extractions.  He has dentures for the  top.  He reports that he used to be compliant with his BiPAP.  He reports that he was diagnosed with severe obstructive sleep apnea, last sleep study was in December 2020, report is not available for my review today but he reports that he was confirmed to have severe sleep apnea.  He has not used his BiPAP in 8 or 9 months.  He had sleep endoscopy in May 2021 and had hypoglossal nerve stimulator placement on 11/11/2019.  His Epworth sleepiness score is 10/24,  Fatigue severity score is 37 out of 63.  He has chronic difficulty initiating and maintaining sleep, primarily going to sleep.  He currently takes amitriptyline for sleep.  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  He had recent carpal tunnel surgery and is supposed to have a right carpal tunnel surgery soon.  He lives in [place], [place], mom lives with him and his youngest daughter also lives with him.  He is divorced for many years, he also has an older daughter.  He smokes about a pack per day, currently not in the process of quitting.  He has a history of alcohol abuse by self-report and goes to AA, does not drink any alcohol any longer, smokes marijuana up to twice a week.  He drinks caffeine in the form of coffee, about 40 ounces per day. He has recuperated well after his inspire procedure, had his left carpal tunnel surgery about a month ago.    His Past Medical History Is Significant For: Past Medical History:  Diagnosis Date   Arthritis    RA   Asthma    Dry mouth    GERD (gastroesophageal reflux disease)    Heavy smoker    smokes 1pp/d   Hypertension    Hypothyroidism    Rheumatoid aortitis    Sleep apnea    does not wear CPAP   Wears dentures    top plate   Wears glasses     His Past Surgical History Is Significant For: Past Surgical History:  Procedure Laterality Date   COLONOSCOPY     DRUG INDUCED ENDOSCOPY N/A 08/15/2019   Procedure: DRUG INDUCED ENDOSCOPY;  Surgeon: Melida Quitter, MD;  Location:  [facility];  Service: ENT;  Laterality: N/A;   HERNIA REPAIR Bilateral    IMPLANTATION OF HYPOGLOSSAL NERVE STIMULATOR Right 11/11/2019   Procedure: IMPLANTATION OF HYPOGLOSSAL NERVE STIMULATOR;  Surgeon: Melida Quitter, MD;  Location: [facility];  Service: ENT;  Laterality: Right;   MULTIPLE TOOTH EXTRACTIONS     ROTATOR CUFF REPAIR Right    WISDOM TOOTH EXTRACTION      His Family History Is Significant For: Family History  Problem Relation Age of Onset   Sleep apnea Neg Hx     His Social History Is Significant For: Social History   Socioeconomic History   Marital status: Single    Spouse name: Not on file   Number of children: Not on file   Years of education: Not on file   Highest education level: Not on file  Occupational History   Not on file  Tobacco Use   Smoking status: Every Day    Packs/day: 1.00    Types: Cigarettes   Smokeless tobacco: Never  Vaping Use   Vaping Use: Never used  Substance and Sexual Activity   Alcohol use: Not Currently   Drug use: Yes    Types: Marijuana    Comment:  " last use 2 weeks ago " 11/10/19   Sexual activity: Not on file  Other Topics Concern   Not on file  Social History Narrative   Not on file   Social Determinants of Health   Financial Resource Strain: Not on file  Food Insecurity: Not on file  Transportation Needs: Not on file  Physical Activity: Not on file  Stress: Not on file  Social Connections: Not on file    His Allergies Are:  Allergies  Allergen Reactions   Other     Gloriann Loan  peppers causes food poisoning  Cats shortness of breath, anaphylaxis     Onion Diarrhea and Nausea And Vomiting    Causes food poisoning    :   His Current Medications Are:  Outpatient Encounter Medications as of 12/31/2021  Medication Sig   acetaminophen (TYLENOL) 650 MG CR tablet Take 650-1,300 mg by mouth every 8 (eight) hours as needed for pain.    albuterol (VENTOLIN HFA) 108 (90 Base) MCG/ACT inhaler Inhale 1-2 puffs  into the lungs every 6 (six) hours as needed for wheezing or shortness of breath.    amitriptyline (ELAVIL) 75 MG tablet Take 75 mg by mouth at bedtime.   Ascorbic Acid (VITAMIN C) 1000 MG tablet Take 1,000 mg by mouth daily.   atorvastatin (LIPITOR) 20 MG tablet Take 20 mg by mouth daily.   cholecalciferol (VITAMIN D3) 25 MCG (1000 UNIT) tablet Take 1,000 Units by mouth daily.   diclofenac (VOLTAREN) 75 MG EC tablet Take 75 mg by mouth 2 (two) times daily.   fexofenadine (ALLEGRA) 180 MG tablet Take 180 mg by mouth daily.   Fluticasone-Salmeterol (ADVAIR) 250-50 MCG/DOSE AEPB Inhale 1 puff into the lungs 2 (two) times daily.   hydroxychloroquine (PLAQUENIL) 200 MG tablet Take 200 mg by mouth 2 (two) times daily.    levothyroxine (SYNTHROID) 88 MCG tablet Take 88 mcg by mouth daily before breakfast.   lisinopril (ZESTRIL) 10 MG tablet Take 10 mg by mouth daily.   mometasone (NASONEX) 50 MCG/ACT nasal spray 2 sprays daily.   montelukast (SINGULAIR) 10 MG tablet Take 10 mg by mouth at bedtime.   omeprazole (PRILOSEC) 40 MG capsule Take 40 mg by mouth daily.   pseudoephedrine (SUDAFED) 120 MG 12 hr tablet Take 120 mg by mouth daily as needed for congestion.   rOPINIRole (REQUIP) 1 MG tablet Take 1 mg by mouth at bedtime.   SUPER B COMPLEX/C PO Take 1 tablet by mouth daily.   vitamin E 180 MG (400 UNITS) capsule Take 400 Units by mouth daily.   celecoxib (CELEBREX) 200 MG capsule Take 200 mg by mouth 2 (two) times daily.   fluticasone (FLONASE) 50 MCG/ACT nasal spray Place 2 sprays into both nostrils daily.    predniSONE (DELTASONE) 2.5 MG tablet Take 2.5 mg by mouth daily with breakfast.   No facility-administered encounter medications on file as of 12/31/2021.  :  Review of Systems:  Out of a complete 14 point review of systems, all are reviewed and negative with the exception of these symptoms as listed below:  Review of Systems  Neurological:        Pt here for Inspire f/u      ESS  :3     Objective:  Neurological Exam  Physical Exam Physical Examination:   Vitals:   12/31/21 1440  BP: 136/84  Pulse: 76    General Examination: The patient is a very pleasant 66 y.o. male in no acute distress. He appears well-developed and well-nourished and well groomed.   HEENT: Extraocular tracking is well preserved, hearing is grossly intact. Face is symmetric with normal facial animation. Speech is clear with no dysarthria noted. There is no hypophonia. There is no lip, neck/head, jaw or voice tremor. Neck shows FROM. Well-healed scar on right, below chin area.  No deviation of his tongue movements with inspire on.    Chest: CTA, no wheezing or crackles.    Heart: Normal heart sounds without murmurs.    Abdomen: Non-distended.   Extremities: There is no  obvious edema.   Skin: Warm and dry without trophic changes noted.   Musculoskeletal: exam reveals no obvious joint deformities. Some motor restlessness noted in the legs and hands, stable.  Right knee in a Velcro brace.   Neurologically: Mental status: The patient is awake, alert and oriented in all 4 spheres. His immediate and remote memory, attention, language skills and fund of knowledge are appropriate. There is no evidence of aphasia, agnosia, apraxia or anomia. Speech is clear with normal prosody and enunciation. Thought process is linear. Mood is normal and affect is normal. Cranial nerves II - XII are as described above under HEENT exam. Motor exam: moving all 4 extremities well.    Assessment and Plan:  In summary, TADEN WITTER is a 66 year old male with an underlying medical history of asthma, allergies, diabetes, hypertension, hyperlipidemia, rheumatoid arthritis, thyroid disease, carpal tunnel syndrome and plantar fasciitis, right knee pain, and overweight state, who presents for follow-up consultation of his obstructive sleep apnea, status post s/p Inspire placement on 11/11/2019. He had his Inspire  activation on 12/22/2019, his initial amplitude was set at 1.0 V with the final titration level of 1.8 V over 10 levels.  He had an interim titration study on 03/20/2020, optimal treatment amplitude was deemed more closer to 1 V with an estimated AHI around 0/h.  In January 2022, we programmed his level 3 amplitude at 1 V with 2 levels above and 2 levels below (i.e. range of 0.8 V to 1.2 V). He has had trouble initiating and maintaining sleep which has been an ongoing problem for years.  He was counseled on improving sleep hygiene today.  He was advised to take his melatonin and ropinirole at 9 PM.  He was advised to try to keep a set bedtime and avoid watching TV while in bed.  He was encouraged to use a sound machine.  He has been using inspire at level 3 which is currently 0.8 V.  He is encouraged to continue with this level but work on sleep hygiene and sleep schedule.  He is encouraged to talk to his prescribing provider about coming off of amitriptyline as it can exacerbate restless leg symptoms.  He does not believe the amitriptyline has helped him sleep at night.  We will plan a phone call check in with him in a month and plan a home sleep test thereafter.  We increased his start delay to 75 minutes from 60 min and kept his pause time at 30 minutes. I answered all his questions today and he was in agreement. I spent 40 minutes in total face-to-face time and in reviewing records during pre-charting, more than 50% of which was spent in counseling and coordination of care, reviewing test results, reviewing medications and treatment regimen and/or in discussing or reviewing the diagnosis of OSA, the prognosis and treatment options. Pertinent laboratory and imaging test results that were available during this visit with the patient were reviewed by me and considered in my medical decision making (see chart for details).

## 2021-12-31 NOTE — Patient Instructions (Addendum)
It was nice to see you again today.  As discussed, we will keep your inspire stimulation setting at level 3 which is 0.8 V.  We have increased your start delay from 60 minutes to 75 minutes.  Please take your melatonin and ropinirole at 9 PM daily, try to keep your bedtime set around 11 or even midnight.  Try not to watch TV while in bed.  Please talk to your prescribing physician about reducing or gradually discontinuing altogether your amitriptyline as it does not help you attain sleep and it may make your restless leg symptoms worse.  We will check in with you via phone call from the sleep lab in about a month, we will consider proceeding with a home sleep test after that phone call and we will plan a follow-up in sleep clinic after home sleep testing.

## 2022-01-01 ENCOUNTER — Ambulatory Visit: Payer: Medicare HMO | Admitting: Neurology

## 2022-01-04 IMAGING — DX DG CHEST 1V
1 series · 1 of 1 positions shown · non-contrast
Comparison: January 30, 2015.

CLINICAL DATA: Obstructive sleep apnea.

EXAM:
CHEST  1 VIEW

[chest ap]
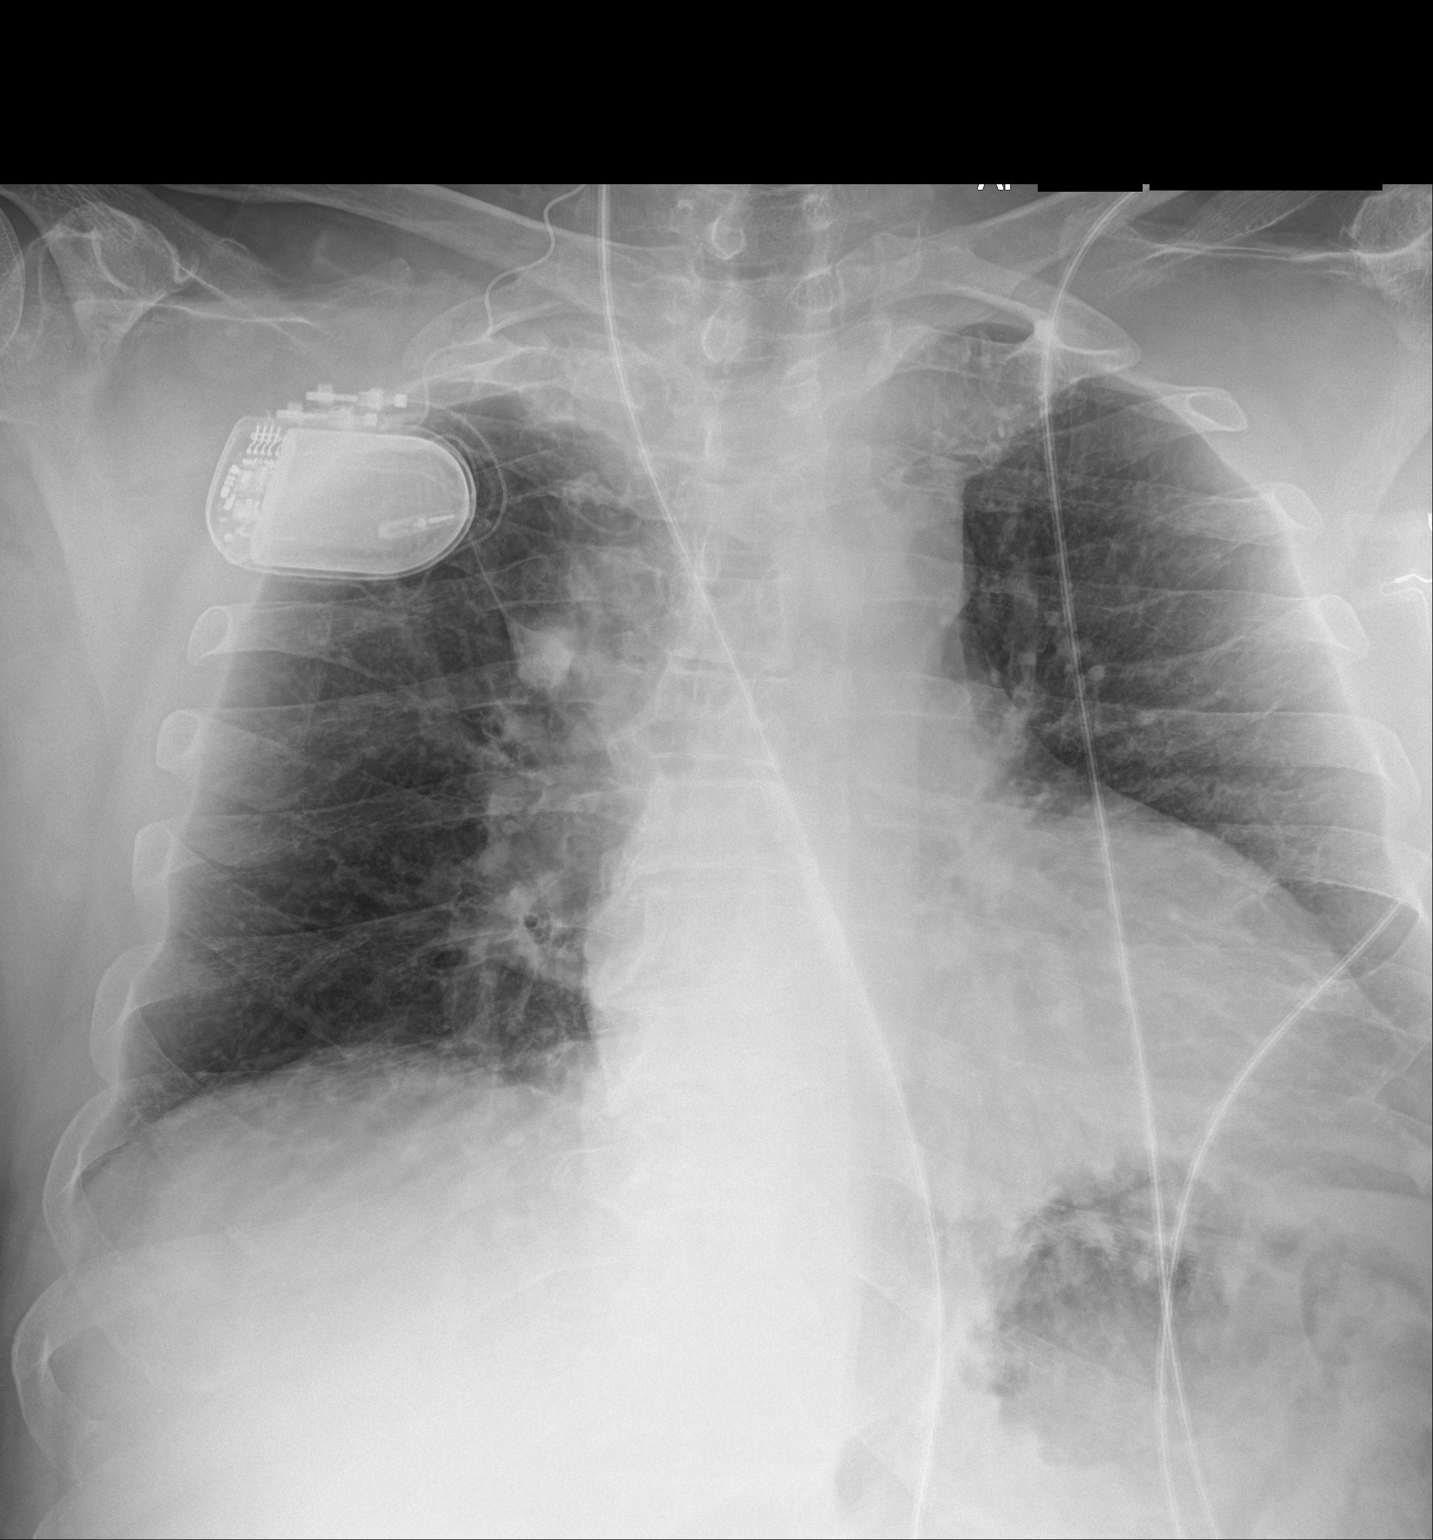

[1 of 1 positions shown; findings below may reference images not displayed]

FINDINGS: The heart size and mediastinal contours are within normal limits.
Both lungs are clear. The visualized skeletal structures are
unremarkable.
IMPRESSION: No active disease.

## 2022-01-04 IMAGING — DX DG NECK SOFT TISSUE
3 series · 3 of 3 positions shown · non-contrast
Comparison: None.

CLINICAL DATA: Obstructive sleep apnea.

EXAM:
NECK SOFT TISSUES - 1+ VIEW

[neck lat (1 of 2)]
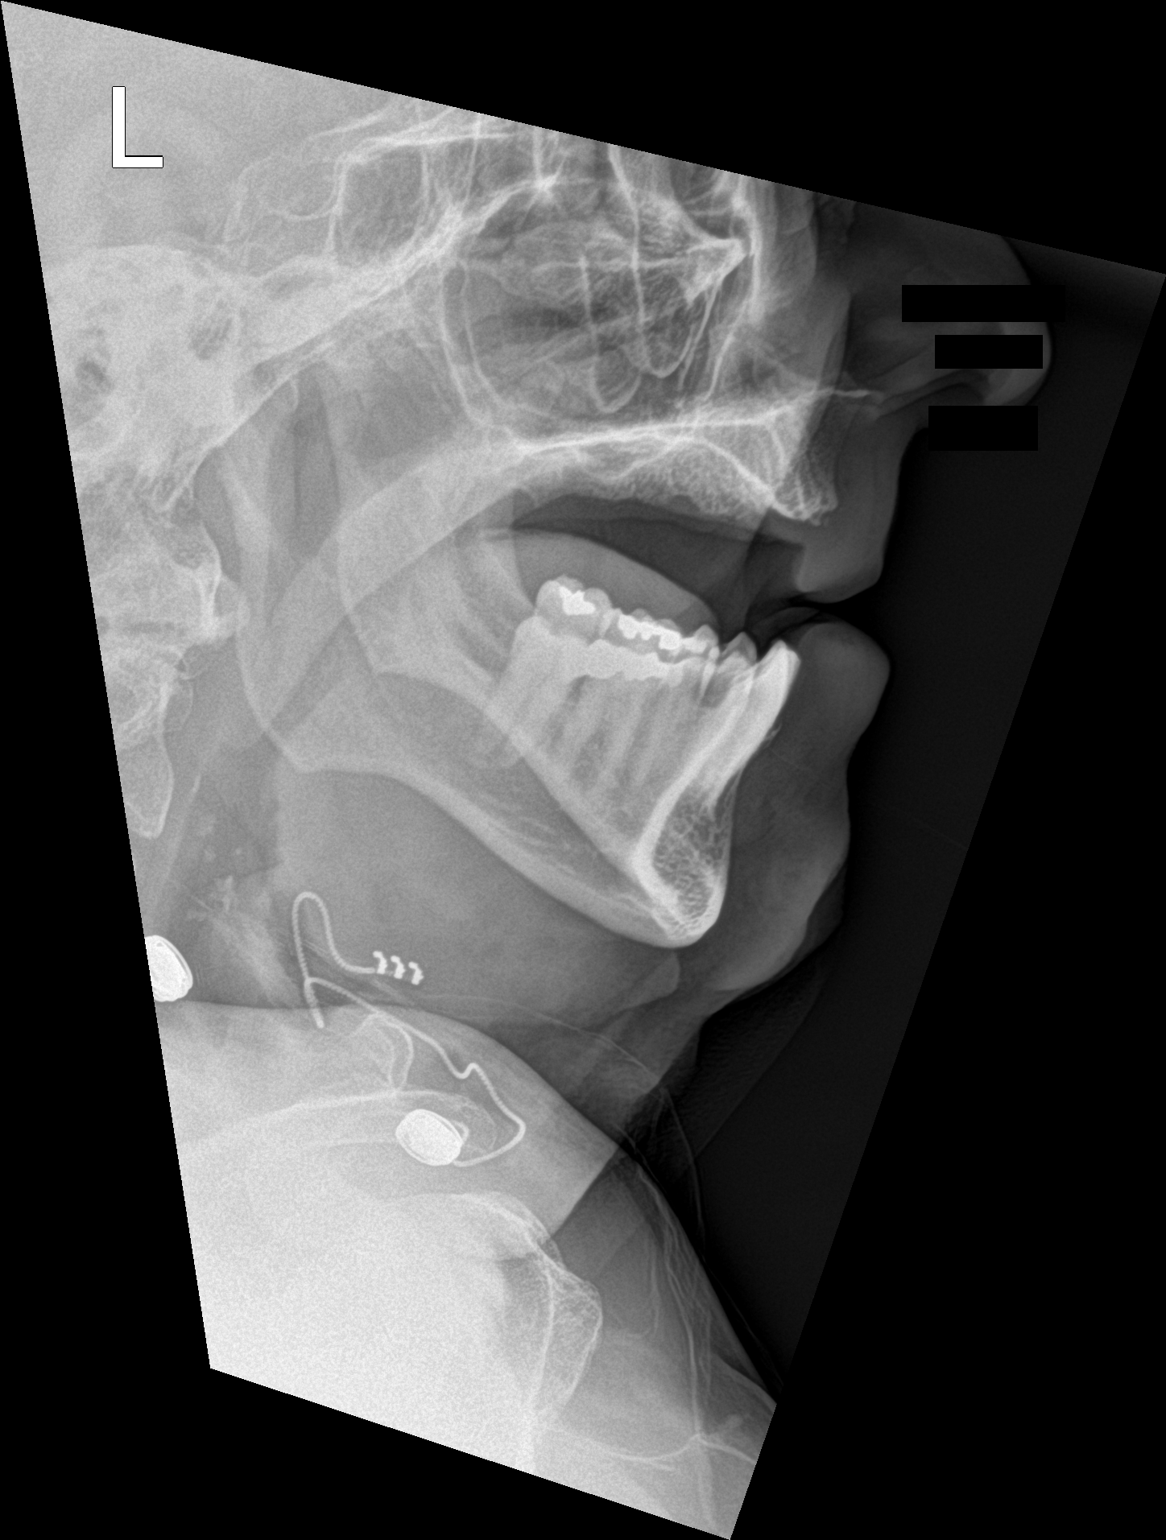

[neck ap]
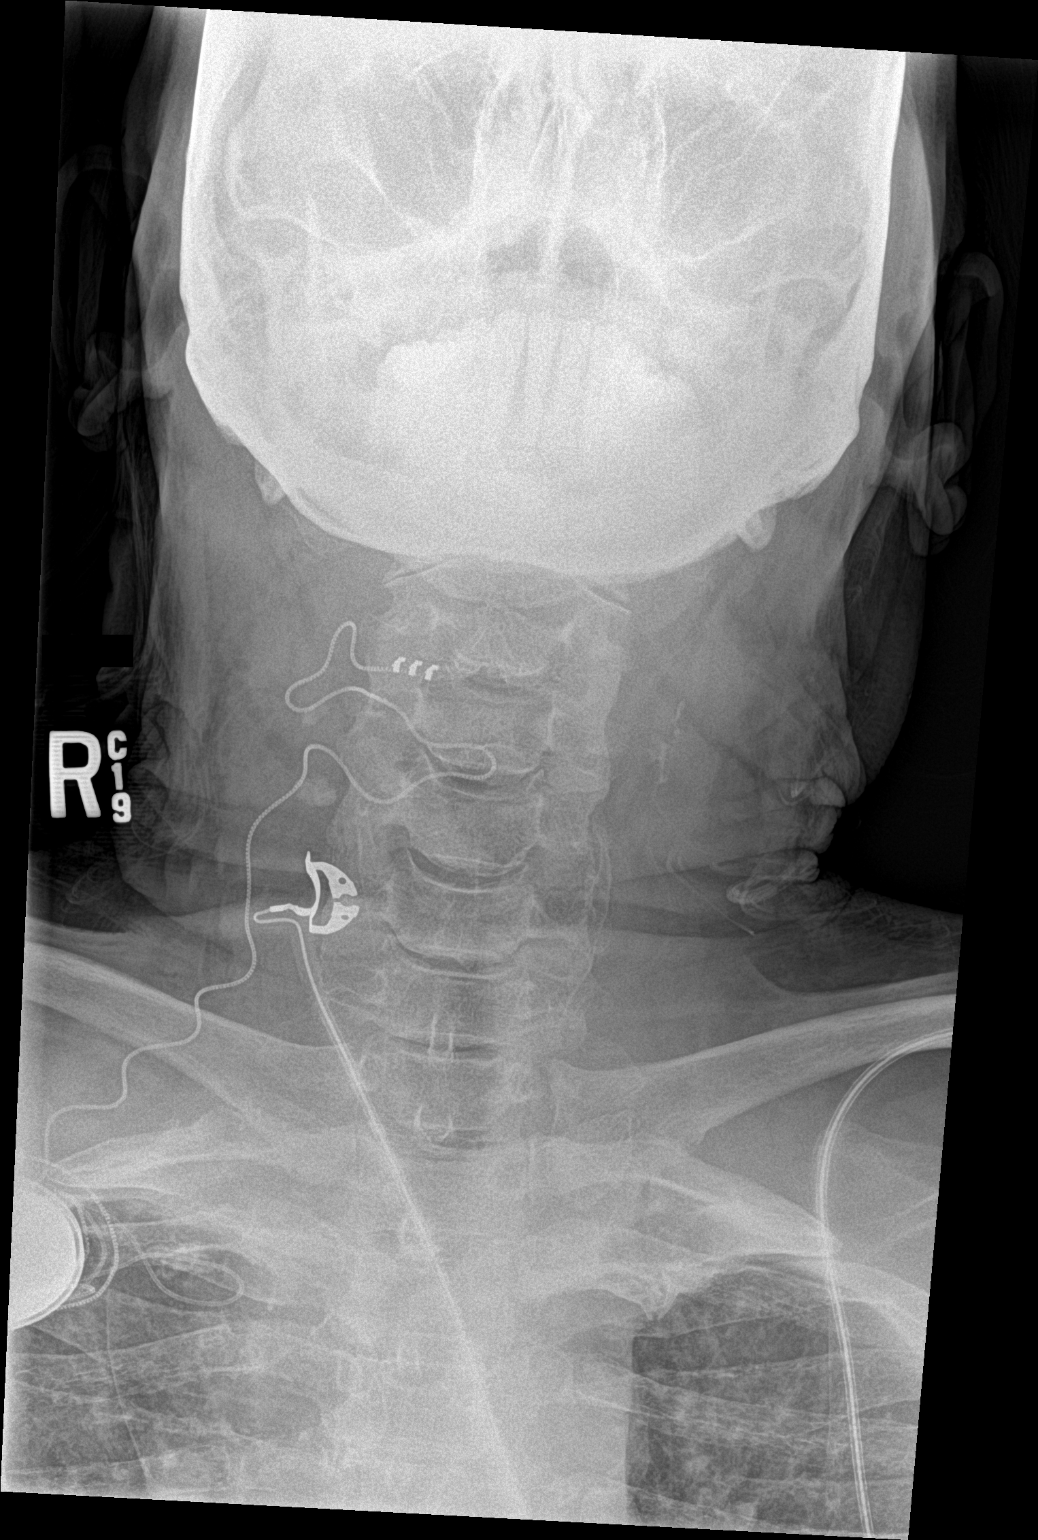

[neck lat (2 of 2)]
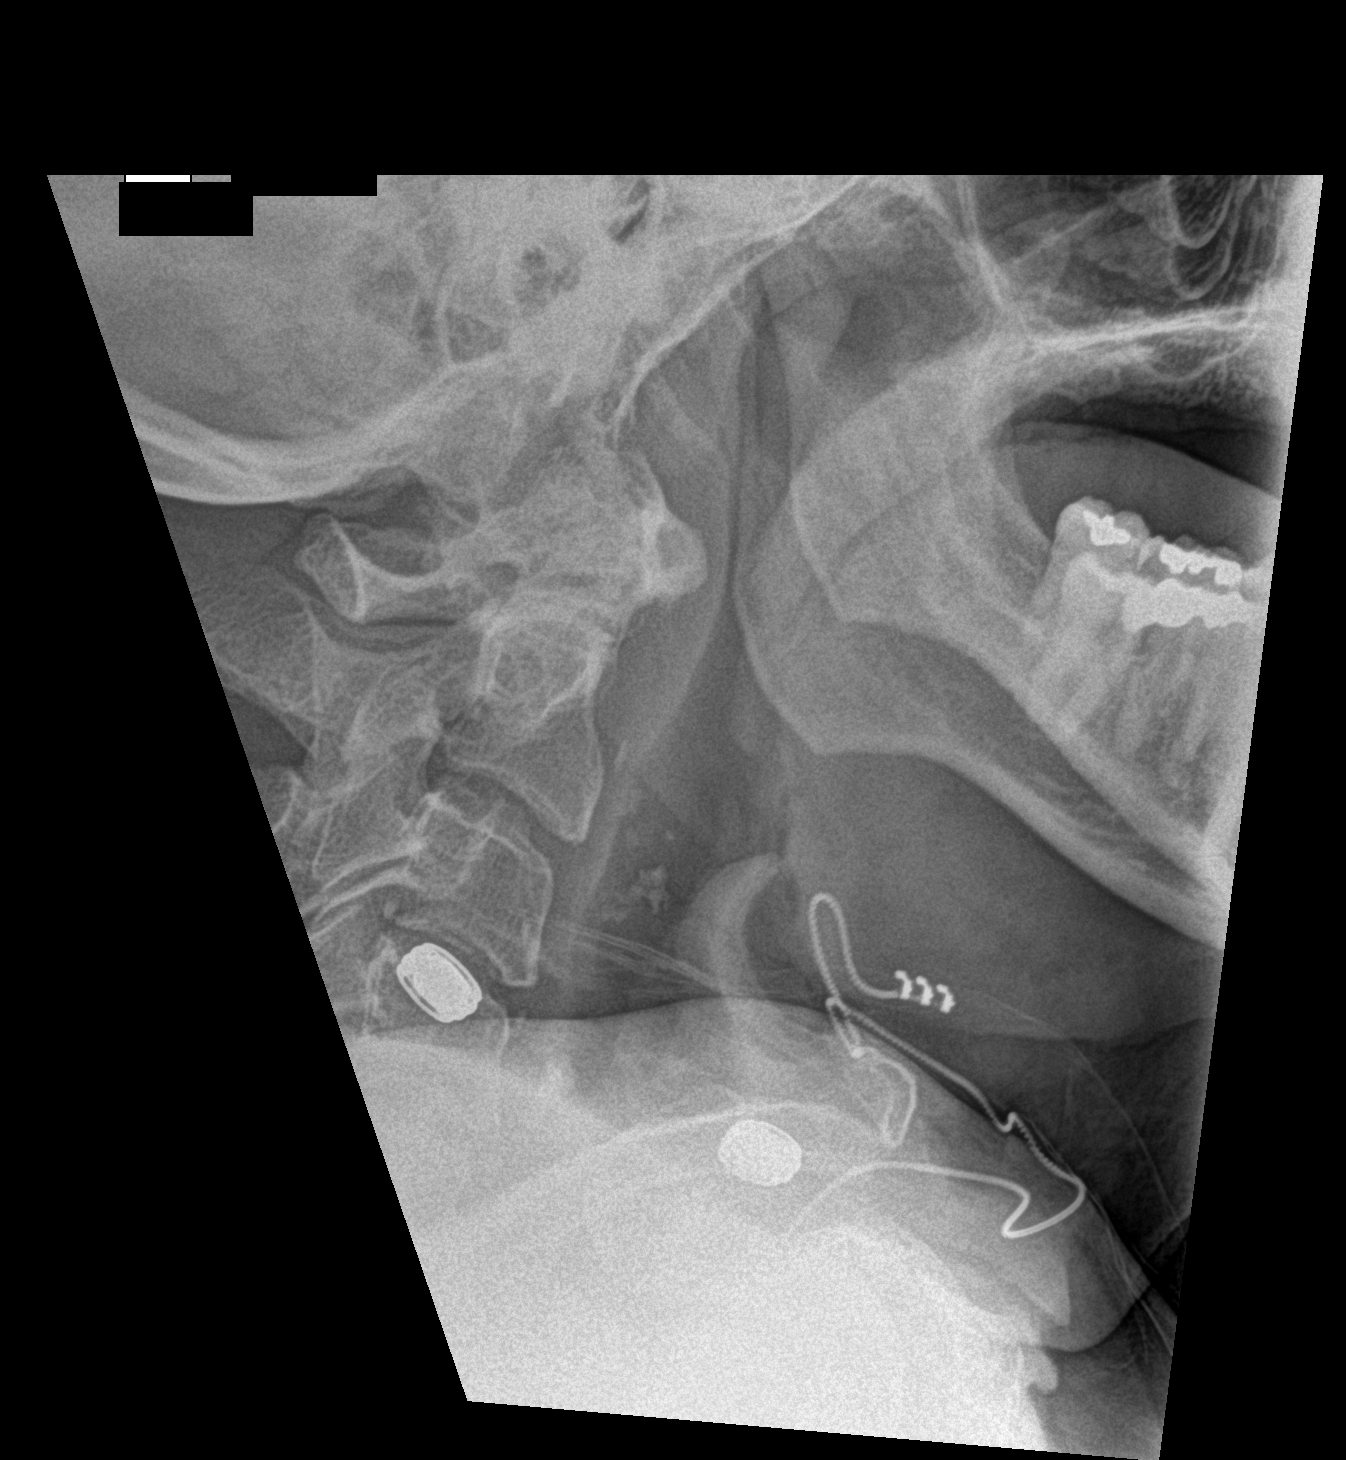

[3 of 3 positions shown; findings below may reference images not displayed]

FINDINGS: There is no evidence of retropharyngeal soft tissue swelling or
epiglottic enlargement. The cervical airway is unremarkable and no
radio-opaque foreign body identified.
IMPRESSION: Negative.
